# Patient Record
Sex: Female | Born: 1990
Health system: Southern US, Community
[De-identification: ages and names within clinical notes are randomized; demographics above are authoritative.]

## PROBLEM LIST (undated history)

## (undated) ENCOUNTER — Inpatient Hospital Stay (HOSPITAL_COMMUNITY): Payer: Self-pay

## (undated) DIAGNOSIS — O09299 Supervision of pregnancy with other poor reproductive or obstetric history, unspecified trimester: Secondary | ICD-10-CM

## (undated) DIAGNOSIS — G2581 Restless legs syndrome: Secondary | ICD-10-CM

## (undated) DIAGNOSIS — Z8619 Personal history of other infectious and parasitic diseases: Secondary | ICD-10-CM

## (undated) DIAGNOSIS — F419 Anxiety disorder, unspecified: Secondary | ICD-10-CM

## (undated) HISTORY — DX: Personal history of other infectious and parasitic diseases: Z86.19

## (undated) HISTORY — PX: TONSILLECTOMY: SUR1361

## (undated) HISTORY — DX: Supervision of pregnancy with other poor reproductive or obstetric history, unspecified trimester: O09.299

## (undated) HISTORY — PX: WISDOM TOOTH EXTRACTION: SHX21

---

## 2015-02-11 LAB — OB RESULTS CONSOLE ABO/RH: RH TYPE: POSITIVE

## 2015-02-11 LAB — OB RESULTS CONSOLE ANTIBODY SCREEN: ANTIBODY SCREEN: NEGATIVE

## 2015-02-11 LAB — OB RESULTS CONSOLE HEPATITIS B SURFACE ANTIGEN: Hepatitis B Surface Ag: NEGATIVE

## 2015-02-11 LAB — OB RESULTS CONSOLE RUBELLA ANTIBODY, IGM: Rubella: IMMUNE

## 2015-02-11 LAB — OB RESULTS CONSOLE RPR: RPR: NONREACTIVE

## 2015-02-11 LAB — OB RESULTS CONSOLE HIV ANTIBODY (ROUTINE TESTING): HIV: NONREACTIVE

## 2015-02-17 ENCOUNTER — Other Ambulatory Visit (HOSPITAL_COMMUNITY)
Admission: RE | Admit: 2015-02-17 | Discharge: 2015-02-17 | Disposition: A | Discharge status: Home / Self Care | Payer: BLUE CROSS/BLUE SHIELD | Source: Ambulatory Visit | Attending: Obstetrics and Gynecology | Admitting: Obstetrics and Gynecology

## 2015-02-17 DIAGNOSIS — Z01419 Encounter for gynecological examination (general) (routine) without abnormal findings: Secondary | ICD-10-CM | POA: Insufficient documentation

## 2015-02-17 LAB — OB RESULTS CONSOLE GC/CHLAMYDIA
Chlamydia: NEGATIVE
GC PROBE AMP, GENITAL: NEGATIVE

## 2015-02-17 LAB — HM PAP SMEAR: HM Pap smear: NEGATIVE

## 2015-03-14 NOTE — L&D Delivery Note (Signed)
Delivery Note At 11:29 AM a viable female was delivered via Vaginal, Spontaneous Delivery (Presentation: Right Occiput Posterior).  APGAR: 7, 9; weight  pending   Placenta status: Intact, Spontaneous.  Cord: 3 vessels with the following complications: None.    Anesthesia: Epidural  Episiotomy: Median;Right Mediolateral Lacerations:   Suture Repair: 2.0 3.0 Est. Blood Loss (mL): 300  Mom to postpartum.  Baby to Couplet care / Skin to Skin.  Myna HidalgoOZAN, Jillann Charette, M 08/28/2015, 12:40 PM

## 2015-06-11 ENCOUNTER — Other Ambulatory Visit: Payer: Self-pay | Admitting: Obstetrics and Gynecology

## 2015-06-11 DIAGNOSIS — O368390 Maternal care for abnormalities of the fetal heart rate or rhythm, unspecified trimester, not applicable or unspecified: Secondary | ICD-10-CM

## 2015-06-15 ENCOUNTER — Ambulatory Visit (HOSPITAL_COMMUNITY)
Admission: RE | Admit: 2015-06-15 | Discharge: 2015-06-15 | Disposition: A | Discharge status: Home / Self Care | Payer: BLUE CROSS/BLUE SHIELD | Source: Ambulatory Visit | Attending: Obstetrics and Gynecology | Admitting: Obstetrics and Gynecology

## 2015-06-15 ENCOUNTER — Encounter (HOSPITAL_COMMUNITY): Payer: Self-pay

## 2015-06-15 ENCOUNTER — Other Ambulatory Visit: Payer: Self-pay | Admitting: Obstetrics and Gynecology

## 2015-06-15 DIAGNOSIS — O99323 Drug use complicating pregnancy, third trimester: Secondary | ICD-10-CM

## 2015-06-15 DIAGNOSIS — Z36 Encounter for antenatal screening of mother: Secondary | ICD-10-CM | POA: Diagnosis present

## 2015-06-15 DIAGNOSIS — Z3A29 29 weeks gestation of pregnancy: Secondary | ICD-10-CM | POA: Insufficient documentation

## 2015-06-15 DIAGNOSIS — O368390 Maternal care for abnormalities of the fetal heart rate or rhythm, unspecified trimester, not applicable or unspecified: Secondary | ICD-10-CM

## 2015-06-15 DIAGNOSIS — Z3689 Encounter for other specified antenatal screening: Secondary | ICD-10-CM

## 2015-06-15 NOTE — Consult Note (Signed)
Maternal Fetal Medicine Consultation  Requesting Provider(s): Geryl RankinsEvelyn Varnado, MD  Reason for consultation: Suspected fetal arrhythmia  HPI: Sandy Johnson is a 25 yo G3P0020, EDD 08/29/2015 who is currently at 29w 2d - seen for consultation due to suspected fetal arrhythmia.  The patient reports that she had an irregular fetal heart beat that was noted on her routine prenatal visit.  Her pregnancy course has been complicated by a history of marijuana  Use - but denies any use since becoming pregnant.  She reports drinking about one caffeinated soda / day and denies coffee or tea use.  She does report using and "organic" lotion for her stretch marks.  She is without complaints today.  OB History: OB History    Gravida Para Term Preterm AB TAB SAB Ectopic Multiple Living   3    2 1 1          PMH: ADD  PSH: Tonsillectomy  Meds: Prenatal vitamins  Allergies:  Allergies  Allergen Reactions  . Codeine    FH: Denies family history of birth defects or hereditary disorders  Soc:  Social History   Social History  . Marital Status: Single    Spouse Name: N/A  . Number of Children: N/A  . Years of Education: N/A   Occupational History  . Not on file.   Social History Main Topics  . Smoking status: Never Smoker   . Smokeless tobacco: Never Used  . Alcohol Use: No  . Drug Use: Yes    Special: Marijuana     Comment: not with this pregnancy   . Sexual Activity: Not on file   Other Topics Concern  . Not on file   Social History Narrative  . No narrative on file    PE:  191 lbs, 118/72, 98  GEN: well-appearing female ABD: gravid, NT  Ultrasound: Single IUP at 29w 2d Normal fetal anatomic survey Some ectopy noted - appears to have some infrequent premature atrial contractions The fetal heart anatomy appears normal Fetal growth is appropriate (40th %tile) Anterior placenta Normal amniotic fluid volume   A/P: 1) Single IUP at 29w 2d  2) Hx of marijuana use (not  during pregnancy)  3) Premature atrial contractions - Some fetal premature atrial contractions are noted on ultrasound today.  This is considered a benign fetal dysrhythmia at usually resolved after delivery and generally does not require further follow up.  There is about a 1% risk that this may evolve into fetal SVT - would recommend weekly fetal doptones in the clinic.  As long as there is no evidence of fetal tachycardia (FHT >200 bpm), no further evaluation is necessary.   Thank you for the opportunity to be a part of the care of [name]. Please contact our office if we can be of further assistance.   I spent approximately 30 minutes with this patient with over 50% of time spent in face-to-face counseling.  Alpha GulaPaul Nithya Meriweather, MD Maternal Fetal Medicine

## 2015-06-21 ENCOUNTER — Encounter (HOSPITAL_COMMUNITY): Payer: Self-pay | Admitting: *Deleted

## 2015-06-21 ENCOUNTER — Inpatient Hospital Stay (HOSPITAL_COMMUNITY)
Admission: AD | Admit: 2015-06-21 | Discharge: 2015-06-21 | Disposition: A | Discharge status: Home / Self Care | Payer: BLUE CROSS/BLUE SHIELD | Source: Ambulatory Visit | Attending: Obstetrics and Gynecology | Admitting: Obstetrics and Gynecology

## 2015-06-21 ENCOUNTER — Inpatient Hospital Stay (HOSPITAL_COMMUNITY): Payer: BLUE CROSS/BLUE SHIELD

## 2015-06-21 DIAGNOSIS — O368190 Decreased fetal movements, unspecified trimester, not applicable or unspecified: Secondary | ICD-10-CM | POA: Diagnosis not present

## 2015-06-21 DIAGNOSIS — O368130 Decreased fetal movements, third trimester, not applicable or unspecified: Secondary | ICD-10-CM | POA: Diagnosis present

## 2015-06-21 DIAGNOSIS — Z3A30 30 weeks gestation of pregnancy: Secondary | ICD-10-CM | POA: Diagnosis not present

## 2015-06-21 HISTORY — DX: Restless legs syndrome: G25.81

## 2015-06-21 HISTORY — DX: Anxiety disorder, unspecified: F41.9

## 2015-06-21 NOTE — MAU Note (Signed)
NO fetal movement in 3 days. No other complaints

## 2015-06-21 NOTE — Discharge Instructions (Signed)
Fetal Movement Counts  Patient Name: __________________________________________________ Patient Due Date: ____________________  Performing a fetal movement count is highly recommended in high-risk pregnancies, but it is good for every pregnant woman to do. Your health care provider may ask you to start counting fetal movements at 28 weeks of the pregnancy. Fetal movements often increase:  · After eating a full meal.  · After physical activity.  · After eating or drinking something sweet or cold.  · At rest.  Pay attention to when you feel the baby is most active. This will help you notice a pattern of your baby's sleep and wake cycles and what factors contribute to an increase in fetal movement. It is important to perform a fetal movement count at the same time each day when your baby is normally most active.   HOW TO COUNT FETAL MOVEMENTS  1. Find a quiet and comfortable area to sit or lie down on your left side. Lying on your left side provides the best blood and oxygen circulation to your baby.  2. Write down the day and time on a sheet of paper or in a journal.  3. Start counting kicks, flutters, swishes, rolls, or jabs in a 2-hour period. You should feel at least 10 movements within 2 hours.  4. If you do not feel 10 movements in 2 hours, wait 2-3 hours and count again. Look for a change in the pattern or not enough counts in 2 hours.  SEEK MEDICAL CARE IF:  · You feel less than 10 counts in 2 hours, tried twice.  · There is no movement in over an hour.  · The pattern is changing or taking longer each day to reach 10 counts in 2 hours.  · You feel the baby is not moving as he or she usually does.  Date: ____________ Movements: ____________ Start time: ____________ Finish time: ____________   Date: ____________ Movements: ____________ Start time: ____________ Finish time: ____________  Date: ____________ Movements: ____________ Start time: ____________ Finish time: ____________  Date: ____________ Movements:  ____________ Start time: ____________ Finish time: ____________  Date: ____________ Movements: ____________ Start time: ____________ Finish time: ____________  Date: ____________ Movements: ____________ Start time: ____________ Finish time: ____________  Date: ____________ Movements: ____________ Start time: ____________ Finish time: ____________  Date: ____________ Movements: ____________ Start time: ____________ Finish time: ____________   Date: ____________ Movements: ____________ Start time: ____________ Finish time: ____________  Date: ____________ Movements: ____________ Start time: ____________ Finish time: ____________  Date: ____________ Movements: ____________ Start time: ____________ Finish time: ____________  Date: ____________ Movements: ____________ Start time: ____________ Finish time: ____________  Date: ____________ Movements: ____________ Start time: ____________ Finish time: ____________  Date: ____________ Movements: ____________ Start time: ____________ Finish time: ____________  Date: ____________ Movements: ____________ Start time: ____________ Finish time: ____________   Date: ____________ Movements: ____________ Start time: ____________ Finish time: ____________  Date: ____________ Movements: ____________ Start time: ____________ Finish time: ____________  Date: ____________ Movements: ____________ Start time: ____________ Finish time: ____________  Date: ____________ Movements: ____________ Start time: ____________ Finish time: ____________  Date: ____________ Movements: ____________ Start time: ____________ Finish time: ____________  Date: ____________ Movements: ____________ Start time: ____________ Finish time: ____________  Date: ____________ Movements: ____________ Start time: ____________ Finish time: ____________   Date: ____________ Movements: ____________ Start time: ____________ Finish time: ____________  Date: ____________ Movements: ____________ Start time: ____________ Finish  time: ____________  Date: ____________ Movements: ____________ Start time: ____________ Finish time: ____________  Date: ____________ Movements: ____________ Start time:   ____________ Finish time: ____________  Date: ____________ Movements: ____________ Start time: ____________ Finish time: ____________  Date: ____________ Movements: ____________ Start time: ____________ Finish time: ____________  Date: ____________ Movements: ____________ Start time: ____________ Finish time: ____________   Date: ____________ Movements: ____________ Start time: ____________ Finish time: ____________  Date: ____________ Movements: ____________ Start time: ____________ Finish time: ____________  Date: ____________ Movements: ____________ Start time: ____________ Finish time: ____________  Date: ____________ Movements: ____________ Start time: ____________ Finish time: ____________  Date: ____________ Movements: ____________ Start time: ____________ Finish time: ____________  Date: ____________ Movements: ____________ Start time: ____________ Finish time: ____________  Date: ____________ Movements: ____________ Start time: ____________ Finish time: ____________   Date: ____________ Movements: ____________ Start time: ____________ Finish time: ____________  Date: ____________ Movements: ____________ Start time: ____________ Finish time: ____________  Date: ____________ Movements: ____________ Start time: ____________ Finish time: ____________  Date: ____________ Movements: ____________ Start time: ____________ Finish time: ____________  Date: ____________ Movements: ____________ Start time: ____________ Finish time: ____________  Date: ____________ Movements: ____________ Start time: ____________ Finish time: ____________  Date: ____________ Movements: ____________ Start time: ____________ Finish time: ____________   Date: ____________ Movements: ____________ Start time: ____________ Finish time: ____________  Date: ____________  Movements: ____________ Start time: ____________ Finish time: ____________  Date: ____________ Movements: ____________ Start time: ____________ Finish time: ____________  Date: ____________ Movements: ____________ Start time: ____________ Finish time: ____________  Date: ____________ Movements: ____________ Start time: ____________ Finish time: ____________  Date: ____________ Movements: ____________ Start time: ____________ Finish time: ____________  Date: ____________ Movements: ____________ Start time: ____________ Finish time: ____________   Date: ____________ Movements: ____________ Start time: ____________ Finish time: ____________  Date: ____________ Movements: ____________ Start time: ____________ Finish time: ____________  Date: ____________ Movements: ____________ Start time: ____________ Finish time: ____________  Date: ____________ Movements: ____________ Start time: ____________ Finish time: ____________  Date: ____________ Movements: ____________ Start time: ____________ Finish time: ____________  Date: ____________ Movements: ____________ Start time: ____________ Finish time: ____________     This information is not intended to replace advice given to you by your health care provider. Make sure you discuss any questions you have with your health care provider.     Document Released: 03/29/2006 Document Revised: 03/20/2014 Document Reviewed: 12/25/2011  Elsevier Interactive Patient Education ©2016 [employer] Inc[employer]

## 2015-06-21 NOTE — MAU Provider Note (Signed)
History   G3P0020 @ 30.1 wks in with c/o no fetal movement x 3 days. Upon admission to unit and placing pt on EFM movement was noted by staff and patient.  CSN: [id number]  Arrival date & time 06/21/15  [id number]   First Provider Initiated Contact with Patient 06/21/15 1905      Chief Complaint  Patient presents with  . Decreased Fetal Movement    HPI  No past medical history on file.  No past surgical history on file.  No family history on file.  Social History  Substance Use Topics  . Smoking status: Never Smoker   . Smokeless tobacco: Never Used  . Alcohol Use: No    OB History    Gravida Para Term Preterm AB TAB SAB Ectopic Multiple Living   3    2 1 1          Review of Systems  Constitutional: Negative.   HENT: Negative.   Eyes: Negative.   Respiratory: Negative.   Cardiovascular: Negative.   Gastrointestinal: Negative.   Endocrine: Negative.   Genitourinary: Negative.   Musculoskeletal: Negative.   Skin: Negative.   Allergic/Immunologic: Negative.   Neurological: Negative.   Hematological: Negative.   Psychiatric/Behavioral: Negative.     Allergies  Codeine  Home Medications  No current outpatient prescriptions on file.  BP 120/73 mmHg  Pulse 92  Temp(Src) 98.1 F (36.7 C) (Oral)  Resp 18  Physical Exam  Constitutional: She is oriented to person, place, and time. She appears well-developed and well-nourished.  HENT:  Head: Normocephalic.  Eyes: Pupils are equal, round, and reactive to light.  Neck: Normal range of motion.  Cardiovascular: Normal rate, regular rhythm, normal heart sounds and intact distal pulses.   Pulmonary/Chest: Effort normal and breath sounds normal.  Abdominal: Soft. Bowel sounds are normal.  Genitourinary: Vagina normal and uterus normal.  Musculoskeletal: Normal range of motion.  Neurological: She is alert and oriented to person, place, and time. She has normal reflexes.  Skin: Skin is warm and dry.  Psychiatric: She  has a normal mood and affect. Her behavior is normal. Judgment and thought content normal.    MAU Course  Procedures (including critical care time)  Labs Reviewed - No data to display No results found.   No diagnosis found.    MDM  BPP 8/8. Dr. Stefano GaulStringer notified and pt. To be discharged

## 2015-07-29 LAB — OB RESULTS CONSOLE GBS: STREP GROUP B AG: NEGATIVE

## 2015-08-28 ENCOUNTER — Encounter (HOSPITAL_COMMUNITY): Payer: Self-pay

## 2015-08-28 ENCOUNTER — Inpatient Hospital Stay (HOSPITAL_COMMUNITY): Payer: BLUE CROSS/BLUE SHIELD | Admitting: Anesthesiology

## 2015-08-28 ENCOUNTER — Inpatient Hospital Stay (HOSPITAL_COMMUNITY)
Admission: AC | Admit: 2015-08-28 | Discharge: 2015-08-30 | DRG: 775 | Disposition: A | Discharge status: Home / Self Care | Payer: BLUE CROSS/BLUE SHIELD | Source: Ambulatory Visit | Attending: Obstetrics & Gynecology | Admitting: Obstetrics & Gynecology

## 2015-08-28 DIAGNOSIS — O99324 Drug use complicating childbirth: Secondary | ICD-10-CM | POA: Diagnosis present

## 2015-08-28 DIAGNOSIS — Z87891 Personal history of nicotine dependence: Secondary | ICD-10-CM

## 2015-08-28 DIAGNOSIS — O99344 Other mental disorders complicating childbirth: Principal | ICD-10-CM | POA: Diagnosis present

## 2015-08-28 DIAGNOSIS — Z833 Family history of diabetes mellitus: Secondary | ICD-10-CM

## 2015-08-28 DIAGNOSIS — O09299 Supervision of pregnancy with other poor reproductive or obstetric history, unspecified trimester: Secondary | ICD-10-CM

## 2015-08-28 DIAGNOSIS — F419 Anxiety disorder, unspecified: Secondary | ICD-10-CM | POA: Diagnosis present

## 2015-08-28 DIAGNOSIS — F1210 Cannabis abuse, uncomplicated: Secondary | ICD-10-CM | POA: Diagnosis present

## 2015-08-28 DIAGNOSIS — F1410 Cocaine abuse, uncomplicated: Secondary | ICD-10-CM | POA: Diagnosis present

## 2015-08-28 DIAGNOSIS — Z3A39 39 weeks gestation of pregnancy: Secondary | ICD-10-CM

## 2015-08-28 DIAGNOSIS — F1911 Other psychoactive substance abuse, in remission: Secondary | ICD-10-CM

## 2015-08-28 DIAGNOSIS — Z3403 Encounter for supervision of normal first pregnancy, third trimester: Secondary | ICD-10-CM | POA: Diagnosis present

## 2015-08-28 DIAGNOSIS — Z8249 Family history of ischemic heart disease and other diseases of the circulatory system: Secondary | ICD-10-CM | POA: Diagnosis not present

## 2015-08-28 HISTORY — DX: Supervision of pregnancy with other poor reproductive or obstetric history, unspecified trimester: O09.299

## 2015-08-28 LAB — ABO/RH: ABO/RH(D): O POS

## 2015-08-28 LAB — TYPE AND SCREEN
ABO/RH(D): O POS
Antibody Screen: NEGATIVE

## 2015-08-28 LAB — RAPID URINE DRUG SCREEN, HOSP PERFORMED
Amphetamines: NOT DETECTED
BARBITURATES: NOT DETECTED
BENZODIAZEPINES: NOT DETECTED
COCAINE: NOT DETECTED
OPIATES: NOT DETECTED
Tetrahydrocannabinol: POSITIVE — AB

## 2015-08-28 LAB — CBC
HCT: 36.8 % (ref 36.0–46.0)
HEMOGLOBIN: 12.8 g/dL (ref 12.0–15.0)
MCH: 27.8 pg (ref 26.0–34.0)
MCHC: 34.8 g/dL (ref 30.0–36.0)
MCV: 80 fL (ref 78.0–100.0)
Platelets: 208 10*3/uL (ref 150–400)
RBC: 4.6 MIL/uL (ref 3.87–5.11)
RDW: 13.6 % (ref 11.5–15.5)
WBC: 19.4 10*3/uL — ABNORMAL HIGH (ref 4.0–10.5)

## 2015-08-28 LAB — RPR: RPR: NONREACTIVE

## 2015-08-28 MED ORDER — LACTATED RINGERS IV SOLN: 500.0000 mL | Freq: Once | INTRAVENOUS | Status: DC

## 2015-08-28 MED ORDER — FENTANYL 2.5 MCG/ML BUPIVACAINE 1/10 % EPIDURAL INFUSION (WH - ANES)
14.0000 mL/h | INTRAMUSCULAR | Status: DC | PRN
  Administered 2015-08-28: 14 mL/h via EPIDURAL
  Filled 2015-08-28: qty 125

## 2015-08-28 MED ORDER — DIPHENHYDRAMINE HCL 50 MG/ML IJ SOLN: 12.5000 mg | INTRAMUSCULAR | Status: DC | PRN

## 2015-08-28 MED ORDER — LACTATED RINGERS IV SOLN
INTRAVENOUS | Status: DC
  Administered 2015-08-28: 11:00:00 via INTRAVENOUS

## 2015-08-28 MED ORDER — PHENYLEPHRINE 40 MCG/ML (10ML) SYRINGE FOR IV PUSH (FOR BLOOD PRESSURE SUPPORT)
80.0000 ug | PREFILLED_SYRINGE | INTRAVENOUS | Status: DC | PRN
  Filled 2015-08-28: qty 5
  Filled 2015-08-28: qty 10

## 2015-08-28 MED ORDER — ACETAMINOPHEN 325 MG PO TABS: 650.0000 mg | ORAL_TABLET | ORAL | Status: DC | PRN

## 2015-08-28 MED ORDER — LIDOCAINE HCL (PF) 1 % IJ SOLN
INTRAMUSCULAR | Status: DC | PRN
  Administered 2015-08-28 (×2): 6 mL

## 2015-08-28 MED ORDER — BENZOCAINE-MENTHOL 20-0.5 % EX AERO
1.0000 "application " | INHALATION_SPRAY | CUTANEOUS | Status: DC | PRN
  Filled 2015-08-28: qty 56

## 2015-08-28 MED ORDER — SOD CITRATE-CITRIC ACID 500-334 MG/5ML PO SOLN: 30.0000 mL | ORAL | Status: DC | PRN

## 2015-08-28 MED ORDER — IBUPROFEN 600 MG PO TABS
600.0000 mg | ORAL_TABLET | Freq: Four times a day (QID) | ORAL | Status: DC
  Administered 2015-08-28 – 2015-08-30 (×8): 600 mg via ORAL
  Filled 2015-08-28 (×8): qty 1

## 2015-08-28 MED ORDER — ZOLPIDEM TARTRATE 5 MG PO TABS: 5.0000 mg | ORAL_TABLET | Freq: Every evening | ORAL | Status: DC | PRN

## 2015-08-28 MED ORDER — LACTATED RINGERS IV SOLN
500.0000 mL | INTRAVENOUS | Status: DC | PRN
  Administered 2015-08-28: 1000 mL via INTRAVENOUS

## 2015-08-28 MED ORDER — WITCH HAZEL-GLYCERIN EX PADS: 1.0000 "application " | MEDICATED_PAD | CUTANEOUS | Status: DC | PRN

## 2015-08-28 MED ORDER — EPHEDRINE 5 MG/ML INJ
10.0000 mg | INTRAVENOUS | Status: DC | PRN
  Filled 2015-08-28: qty 2

## 2015-08-28 MED ORDER — ONDANSETRON HCL 4 MG PO TABS: 4.0000 mg | ORAL_TABLET | ORAL | Status: DC | PRN

## 2015-08-28 MED ORDER — LIDOCAINE HCL (PF) 1 % IJ SOLN
30.0000 mL | INTRAMUSCULAR | Status: DC | PRN
Start: 2015-08-28 — End: 2015-08-28
  Filled 2015-08-28: qty 30

## 2015-08-28 MED ORDER — ONDANSETRON HCL 4 MG/2ML IJ SOLN
4.0000 mg | INTRAMUSCULAR | Status: DC | PRN
Start: 2015-08-28 — End: 2015-08-30

## 2015-08-28 MED ORDER — OXYTOCIN 40 UNITS IN LACTATED RINGERS INFUSION - SIMPLE MED
2.5000 [IU]/h | INTRAVENOUS | Status: DC
  Filled 2015-08-28: qty 1000

## 2015-08-28 MED ORDER — PRENATAL MULTIVITAMIN CH
1.0000 | ORAL_TABLET | Freq: Every day | ORAL | Status: DC
  Administered 2015-08-29 – 2015-08-30 (×2): 1 via ORAL
  Filled 2015-08-28 (×2): qty 1

## 2015-08-28 MED ORDER — SIMETHICONE 80 MG PO CHEW: 80.0000 mg | CHEWABLE_TABLET | ORAL | Status: DC | PRN

## 2015-08-28 MED ORDER — FLEET ENEMA 7-19 GM/118ML RE ENEM: 1.0000 | ENEMA | RECTAL | Status: DC | PRN

## 2015-08-28 MED ORDER — PHENYLEPHRINE 40 MCG/ML (10ML) SYRINGE FOR IV PUSH (FOR BLOOD PRESSURE SUPPORT)
80.0000 ug | PREFILLED_SYRINGE | INTRAVENOUS | Status: DC | PRN
  Filled 2015-08-28: qty 5

## 2015-08-28 MED ORDER — ACETAMINOPHEN 325 MG PO TABS
650.0000 mg | ORAL_TABLET | ORAL | Status: DC | PRN
  Administered 2015-08-29: 650 mg via ORAL
  Filled 2015-08-28: qty 2

## 2015-08-28 MED ORDER — OXYTOCIN BOLUS FROM INFUSION: 500.0000 mL | INTRAVENOUS | Status: DC

## 2015-08-28 MED ORDER — ONDANSETRON HCL 4 MG/2ML IJ SOLN: 4.0000 mg | Freq: Four times a day (QID) | INTRAMUSCULAR | Status: DC | PRN

## 2015-08-28 MED ORDER — COCONUT OIL OIL
1.0000 "application " | TOPICAL_OIL | Status: DC | PRN
  Administered 2015-08-29: 1 via TOPICAL
  Filled 2015-08-28: qty 120

## 2015-08-28 MED ORDER — DIBUCAINE 1 % RE OINT: 1.0000 "application " | TOPICAL_OINTMENT | RECTAL | Status: DC | PRN

## 2015-08-28 MED ORDER — SENNOSIDES-DOCUSATE SODIUM 8.6-50 MG PO TABS
2.0000 | ORAL_TABLET | ORAL | Status: DC
  Filled 2015-08-28 (×2): qty 2

## 2015-08-28 MED ORDER — DIPHENHYDRAMINE HCL 25 MG PO CAPS: 25.0000 mg | ORAL_CAPSULE | Freq: Four times a day (QID) | ORAL | Status: DC | PRN

## 2015-08-28 MED ORDER — FENTANYL CITRATE (PF) 100 MCG/2ML IJ SOLN: 100.0000 ug | INTRAMUSCULAR | Status: DC | PRN

## 2015-08-28 NOTE — MAU Note (Signed)
Contractions since 0230. Every 2 min.  2 cm in office. No bleeding.  Mucus d/c. Came here by EMS. Baby moving well.

## 2015-08-28 NOTE — Lactation Note (Signed)
This note was copied from a baby's chart. Lactation Consultation Note  Patient Name: Sandy [name] WUJWJ'XToday's Date: 08/28/2015 Reason for consult: Initial assessment Mom reporting baby not latching to left breast. LC assisted in football hold, baby latched easily using breast compression to left breast. Basic teaching reviewed with Mom, encouraged to BF with feeding ques, cluster feeding discussed.  Lactation brochure left for review, advised of OP services and support group. Risk of marijuana use with BF discussed, hand out given. Encouraged not to use MJ while breastfeeding. Encouraged to call for assist as needed.   Maternal Data Has patient been taught Hand Expression?: Yes Does the patient have breastfeeding experience prior to this delivery?: No  Feeding Feeding Type: Breast Fed Length of feed: 15 min  LATCH Score/Interventions Latch: Grasps breast easily, tongue down, lips flanged, rhythmical sucking. Intervention(s): Adjust position;Assist with latch;Breast massage;Breast compression  Audible Swallowing: A few with stimulation  Type of Nipple: Everted at rest and after stimulation  Comfort (Breast/Nipple): Soft / non-tender     Hold (Positioning): Assistance needed to correctly position infant at breast and maintain latch. Intervention(s): Breastfeeding basics reviewed;Support Pillows;Position options;Skin to skin  LATCH Score: 8  Lactation Tools Discussed/Used WIC Program: No   Consult Status Consult Status: Follow-up Date: 08/29/15 Follow-up type: In-patient    Alfred LevinsGranger, Rykker Coviello Ann 08/28/2015, 10:49 PM

## 2015-08-28 NOTE — Progress Notes (Addendum)
CLINICAL SOCIAL WORK MATERNAL/CHILD NOTE  Patient Details  Name: Sandy Johnson MRN: [id number] Date of Birth: 08/28/2016  Date: 08/28/2015  Clinical Social Worker Initiating Note: Ander Purpura CarterDate/ Time Initiated: 08/28/15/1510   Child's Name: Sandy Johnson  Legal Guardian: Mother   Need for Interpreter: None   Date of Referral: 08/28/15   Reason for Referral: Current Substance Use/Substance Use During Pregnancy  (Mother tested positive for [facility] on day of delivery)   Referral Source: RN   Address: [address]  Phone number: [phone]   Household Members: Self, Significant Other   Natural Supports (not living in the home): Friends, Immediate Family   Professional Supports:None   Employment:Part-time   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 Pt appeared apprehensive on approach. Pt expressed surprise and concern when CSW informed MOB of the positive drug screen as CSW was the first to inform her. MOB expressed that she had not consumed THC since she found out that she was pregnant; however, Pt feels that inhaling significant amounts of second hand smoke on a recent trip (including in unventilated cars) is the cause for the positive result. MOB became tearful and described feeling worried that she would lose custody of her child. MOB reports that 4  weeks ago, she was tested at her outpatient OBGYN office and the results were negative. CSW informed MOB that the infant's UDS results would determine whether or not a CPS report would need to be made. CSW offered emotional support and answered questions that MOB had. During the assessment, MOB's mother entered the room and found MOB tearful. MOB explained what happened and expressed that she is scared. MGM was supportive.  MOB lives with boyfriend and FOB and works part-time at [employer]; she is currently on maternity leave. CSW reports that staff will inform her of the UDS findings and further steps.   CSW Plan/Description:  (TBD based on infant UDS)    Bo Mcclintock, LCSW 08/28/2015, 3:18 PM

## 2015-08-28 NOTE — Anesthesia Preprocedure Evaluation (Addendum)
Anesthesia Evaluation  Patient identified by MRN, date of birth, ID band Patient awake    Reviewed: Allergy & Precautions, NPO status , Patient's Chart, lab work & pertinent test results  Airway Mallampati: II  TM Distance: >3 FB Neck ROM: Full    Dental  (+) Teeth Intact   Pulmonary former smoker,    breath sounds clear to auscultation       Cardiovascular negative cardio ROS   Rhythm:Regular Rate:Normal     Neuro/Psych Anxiety negative neurological ROS     GI/Hepatic negative GI ROS, Neg liver ROS,   Endo/Other  negative endocrine ROS  Renal/GU   negative genitourinary   Musculoskeletal negative musculoskeletal ROS (+)   Abdominal   Peds negative pediatric ROS (+)  Hematology negative hematology ROS (+)   Anesthesia Other Findings   Reproductive/Obstetrics (+) Pregnancy                            Lab Results  Component Value Date   WBC 19.4* 08/28/2015   HGB 12.8 08/28/2015   HCT 36.8 08/28/2015   MCV 80.0 08/28/2015   PLT 208 08/28/2015   No results found for: INR, PROTIME   Anesthesia Physical Anesthesia Plan  ASA: II  Anesthesia Plan: Epidural   Post-op Pain Management:    Induction:   Airway Management Planned:   Additional Equipment:   Intra-op Plan:   Post-operative Plan:   Informed Consent: I have reviewed the patients History and Physical, chart, labs and discussed the procedure including the risks, benefits and alternatives for the proposed anesthesia with the patient or authorized representative who has indicated his/her understanding and acceptance.     Plan Discussed with:   Anesthesia Plan Comments: (Informed consent obtained prior to proceeding including risk of failure, 1% risk of PDPH, risk of minor discomfort and bruising.  Discussed rare but serious complications including epidural abscess, permanent nerve injury, epidural hematoma.  Discussed  alternatives to epidural analgesia and patient desires to proceed.  Timeout performed pre-procedure verifying patient name, procedure, and platelet count.  Patient tolerated procedure well. )       Anesthesia Quick Evaluation

## 2015-08-28 NOTE — H&P (Signed)
Sandy EhlersChristina Johnson is a 25 y.o. female presenting for contractions increasing in frequency and intensity since 0230 this morning.   . Maternal Medical History:  Reason for admission: Contractions.   Contractions: Onset was 3-5 hours ago.   Frequency: irregular.   Duration is approximately 30 seconds.   Perceived severity is moderate.    Fetal activity: Perceived fetal activity is normal.   Last perceived fetal movement was within the past hour.    Prenatal complications: Substance abuse.   Prenatal Complications - Diabetes: none.    OB History    Gravida Para Term Preterm AB TAB SAB Ectopic Multiple Living   3    2 1 1         Past Medical History  Diagnosis Date  . Anxiety   . RLS (restless legs syndrome)    Past Surgical History  Procedure Laterality Date  . Tonsillectomy    . Wisdom tooth extraction     Family History: family history includes Diabetes in her father, maternal grandfather, and maternal grandmother; Heart disease in her father and paternal grandfather; Hypertension in her father. Social History:  reports that she has quit smoking. She has never used smokeless tobacco. She reports that she uses illicit drugs (Marijuana). She reports that she does not drink alcohol.   Prenatal Transfer Tool  Maternal Diabetes: No Genetic Screening: Normal Maternal Ultrasounds/Referrals: Normal Fetal Ultrasounds or other Referrals:  None Maternal Substance Abuse:  Yes:  Type: Marijuana, Cocaine, Other: Zanax Significant Maternal Medications:  None Significant Maternal Lab Results:  Lab values include: Group B Strep negative Other Comments:  None  ROS   FHR:  Cat 1, 135 bpm, moderate variability, +accels, no decels UC: irregular, every 3-5 min,   Dilation: 4 Effacement (%): 90 Station: -1 Exam by:: Fleet Contrasachel Stall CNM Height 5\' 4"  (1.626 m), weight 91.173 kg (201 lb). Exam Physical Exam  Constitutional: She is oriented to person, place, and time. She appears  well-developed. She appears distressed.  HENT:  Head: Normocephalic.  Eyes: Pupils are equal, round, and reactive to light.  Neck: Normal range of motion.  Cardiovascular: Normal rate and regular rhythm.   Respiratory: Effort normal and breath sounds normal.  GI: Soft.  Musculoskeletal: Normal range of motion.  Neurological: She is alert and oriented to person, place, and time.  Skin: Skin is warm and dry.  Psychiatric: She has a normal mood and affect. Her behavior is normal.    Prenatal labs: ABO, Rh: O/Positive/-- (12/01 0000) Antibody: Negative (12/01 0000) Rubella: Immune (12/01 0000) RPR: Nonreactive (12/01 0000)  HBsAg: Negative (12/01 0000)  HIV: Non-reactive (12/01 0000)  GBS: Negative (05/18 0000)   Assessment/Plan: GBS Negative Cat 1 FT Early labor Admit to birthing suites May have Pain med/Epidural PRN Routine [facility] orders Expectant management Anticipate SVD     Alphonzo SeveranceRachel Stall 08/28/2015, 7:06 AM

## 2015-08-28 NOTE — Anesthesia Procedure Notes (Signed)
Epidural Patient location during procedure: OB  Staffing Anesthesiologist: Leslyn Monda  Preanesthetic Checklist Completed: patient identified, site marked, surgical consent, pre-op evaluation, timeout performed, IV checked, risks and benefits discussed and monitors and equipment checked  Epidural Patient position: sitting Prep: DuraPrep Patient monitoring: blood pressure and heart rate Approach: midline Location: L3-L4 Injection technique: LOR saline  Needle:  Needle type: Tuohy  Needle gauge: 17 G Needle length: 9 cm Needle insertion depth: 6 cm Catheter type: closed end flexible Catheter size: 19 Gauge Catheter at skin depth: 13 cm Test dose: negative and Other  Assessment Events: blood not aspirated, injection not painful, no injection resistance, negative IV test and no paresthesia  Additional Notes Reason for block:procedure for pain   

## 2015-08-28 NOTE — Progress Notes (Signed)
OB PN:  S: Pt resting comfortably, no acute complaints  O: BP 122/75 mmHg  Pulse 102  Temp(Src) 97.7 F (36.5 C)  Resp 16  Ht 5\' 4"  (1.626 m)  Wt 91.173 kg (201 lb)  BMI 34.48 kg/m2  SpO2 99%  FHT: 140bpm, moderate variablity, + accels, no decels Toco: q3-614min SVE: 5/90/-1, FSE placed, AROM though suspect pt already ruptured  A/P: 25 y.o. G3P0020 @ 5732w6d 1. FWB: Cat. I, FSE placed due to difficulty tracing FHT s/p epidural 2. Labor: expectant management Pain: continue with epidural GBS: negative -h/o drug use, +marijuana use, []  Utox collected  Myna HidalgoJennifer Clarke Peretz, OhioDO [phone] (pager) [phone] (office)

## 2015-08-28 NOTE — Progress Notes (Signed)
OB PN:  S: Pt starting to feel increased pressure and discomfort  O: BP 105/63 mmHg  Pulse 106  Temp(Src) 97.7 F (36.5 C)  Resp 16  Ht 5\' 4"  (1.626 m)  Wt 91.173 kg (201 lb)  BMI 34.48 kg/m2  SpO2 100%  FHT: 140bpm, moderate variablity, + accels, no decels Toco: q3-384min SVE: C/C/+1  Utox: +marijuana  A/P: 25 y.o. G3P0020 @ 3192w6d 1. FWB: Cat. I, FSE in place 2. Labor: expectant management Pain: continue with epidural GBS: negative -h/o drug use, +marijuana use  Myna HidalgoJennifer Jaiyden Laur, DO [phone] (pager) [phone] (office)

## 2015-08-28 NOTE — Anesthesia Pain Management Evaluation Note (Signed)
  CRNA Pain Management Visit Note  Patient: Sandy EhlersChristina Johnson, 25 y.o., female  "Hello I am a member of the anesthesia team at [facility]. We have an anesthesia team available at all times to provide care throughout the hospital, including epidural management and anesthesia for C-section. I don't know your plan for the delivery whether it a natural birth, water birth, IV sedation, nitrous supplementation, doula or epidural, but we want to meet your pain goals."   1.Was your pain managed to your expectations on prior hospitalizations?   No prior hospitalizations  2.What is your expectation for pain management during this hospitalization?     Labor support without medications  3.How can we help you reach that goal? Epidural  Record the patient's initial score and the patient's pain goal.   Pain: 0  Pain Goal: 3 [facility] wants you to be able to say your pain was always managed very well.  Sandy ShellingBURGER,Sandy Johnson 08/28/2015

## 2015-08-29 LAB — CBC
HCT: 31.5 % — ABNORMAL LOW (ref 36.0–46.0)
HEMOGLOBIN: 10.5 g/dL — AB (ref 12.0–15.0)
MCH: 27.5 pg (ref 26.0–34.0)
MCHC: 33.3 g/dL (ref 30.0–36.0)
MCV: 82.5 fL (ref 78.0–100.0)
PLATELETS: 151 10*3/uL (ref 150–400)
RBC: 3.82 MIL/uL — AB (ref 3.87–5.11)
RDW: 13.9 % (ref 11.5–15.5)
WBC: 13.2 10*3/uL — ABNORMAL HIGH (ref 4.0–10.5)

## 2015-08-29 NOTE — Anesthesia Postprocedure Evaluation (Signed)
Anesthesia Post Note  Patient: Sandy EhlersChristina Johnson  Procedure(s) Performed: * No procedures listed *  Patient location during evaluation: Mother Baby Anesthesia Type: Epidural Level of consciousness: awake Pain management: satisfactory to patient Vital Signs Assessment: post-procedure vital signs reviewed and stable Respiratory status: spontaneous breathing Cardiovascular status: stable Anesthetic complications: no     Last Vitals:  Filed Vitals:   08/28/15 2358 08/29/15 0500  BP: 102/59 119/86  Pulse: 100 97  Temp: 37 C 36.4 C  Resp: 18 18    Last Pain:  Filed Vitals:   08/29/15 1116  PainSc: 0-No pain   Pain Goal: Patients Stated Pain Goal: 0 (08/28/15 0615)               Cephus ShellingBURGER,Astou Lada

## 2015-08-29 NOTE — Progress Notes (Signed)
Postpartum Note Day # 1  S:  Patient resting comfortable in bed.  Pain controlled.  Tolerating general diet. + flatus, no BM.  Lochia appropriate.  Ambulating without difficulty.  She denies n/v/f/c, SOB, or CP.  Pt plans on breastfeeding.  O: Temp:  [97.5 F (36.4 C)-99.1 F (37.3 C)] 97.5 F (36.4 C) (06/18 0500) Pulse Rate:  [93-233] 97 (06/18 0500) Resp:  [16-18] 18 (06/18 0500) BP: (96-132)/(38-97) 119/86 mmHg (06/18 0500) SpO2:  [97 %-100 %] 100 % (06/18 0500) Weight:  [91.173 kg (201 lb)] 91.173 kg (201 lb) (06/17 0754)   Gen: A&Ox3, NAD CV: RRR, no MRG Resp: CTAB Abdomen: soft, NT, ND Uterus: firm, non-tender, below umbilicus Ext: No edema, no calf tenderness bilaterally  Labs:  Recent Labs  08/28/15 0603 08/29/15 0532  HGB 12.8 10.5*    A/P: Pt is a 25 y.o. [id number] s/p NSVD, PPD#1  - Pain well controlled -GU: UOP is adequate -GI: Tolerating general diet -Activity: encouraged sitting up to chair and ambulation as tolerated -Prophylaxis: early ambulation -Labs: appropriate following delivery, pt asymptomatic -Baby boy circ to be completed today  Pt meeting postpartum milestones appropriate, pending Peds possible discharge home today.  Myna HidalgoJennifer Milee Qualls, DO [phone] (pager) [phone] (office)

## 2015-08-30 MED ORDER — IBUPROFEN 600 MG PO TABS: 600.0000 mg | ORAL_TABLET | Freq: Four times a day (QID) | ORAL | Status: DC

## 2015-08-30 NOTE — Lactation Note (Signed)
This note was copied from a baby's chart. Lactation Consultation Note  Patient Name: Boy Neta EhlersChristina Osowski ZOXWR'UToday's Date: 08/30/2015 Reason for consult: Follow-up assessment;Hyperbilirubinemia   Follow up with mom of 51 hour old infant. Infant with 8 BF for 10-25 minutes, 3 voids and 4 stools in last 24 hours. LATCH Scores 10 by bedside RN. Infant weight 6 lb 4.5 oz with 7% weight loss since birth. Awaiting post phototherapy bilirubin to determine d/c home today.    Mom reports she is experiencing some burning type nipple pain past feed, she is using coconut oil to nipples. Reviewed BF basics and enc mom to support infant and maintain deep latch throughout feeding. Offered to observe a feeding, mom reports he just finished feeding for an hour. Advised mom that if nipple soreness worsens to please call LC.   Mom has an Ahmeda DEBP at home for use. She has made infant an op appt. Reviewed all BF information in Taking Care of Baby and Me Booklet. Reviewed Engorgement prevention/treatment, pre pumping and comfort pumping. Reviewed I/O and enc mom to maintain feeding log and take to Ped appt. Reviewed LC Brochure, mom aware of OP services, BF Support Groups and LC phone #. Enc mom to call with questions/concerns prn.   Maternal Data Formula Feeding for Exclusion: No Does the patient have breastfeeding experience prior to this delivery?: No  Feeding Feeding Type: Breast Fed Length of feed: 14 min  LATCH Score/Interventions                      Lactation Tools Discussed/Used     Consult Status Consult Status: Complete Follow-up type: Call as needed    Ed BlalockSharon S Matheo Rathbone 08/30/2015, 3:15 PM

## 2015-08-30 NOTE — Discharge Instructions (Signed)

## 2015-09-08 NOTE — Progress Notes (Signed)
CSW monitored cord screen.  CSW made report to CPS for positive cord screen.

## 2015-09-29 NOTE — Discharge Summary (Signed)
OB Discharge Summary     Patient Name: Sandy EhlersChristina Ryback DOB: 26-Jul-1990 MRN: [id number]  Date of admission: 08/28/2015 Delivering MD: Myna HidalgoZAN, JENNIFER   Date of discharge: 09/29/2015  Admitting diagnosis: 40 WEEKS - 1 DAY, IN LABOR Intrauterine pregnancy: 3789w6d     Secondary diagnosis:  Active Problems:   Normal labor   Hx of mixed drug abuse   Prior pregnancy with fetal demise  Additional problems: None     Discharge diagnosis: Term Pregnancy Delivered                                                                                                Post partum procedures:None  Augmentation: AROM and suspected SROM prior to AROM  Complications: None  Hospital course:  Onset of Labor With Vaginal Delivery     25 y.o. yo [id number] at 4289w6d was admitted in Active Labor on 08/28/2015. Patient had an uncomplicated labor course as follows:  Membrane Rupture Time/Date: 7:35 AM ,08/28/2015   Intrapartum Procedures: Episiotomy: Median [2];Left Mediolateral [3]                                         Lacerations:     Patient had a delivery of a Viable infant. 08/28/2015  Information for the patient's newborn:  Haze JustinButler, Mason Colt [[id number]][[id number]]  Delivery Method: Vaginal, Spontaneous Delivery (Filed from Delivery Summary)    Pateint had an uncomplicated postpartum course.  She is ambulating, tolerating a regular diet, passing flatus, and urinating well. Patient is discharged home in stable condition on 09/29/2015.    Physical exam  Filed Vitals:   08/28/15 2358 08/29/15 0500 08/29/15 1756 08/30/15 0600  BP: 102/59 119/86 103/57 105/59  Pulse: 100 97 109 85  Temp: 98.6 F (37 C) 97.5 F (36.4 C) 98.4 F (36.9 C) 97.6 F (36.4 C)  TempSrc: Oral Oral Oral Oral  Resp: 18 18 16 18   Height:      Weight:      SpO2: 100% 100% 99%    General: alert, cooperative and no distress Lochia: appropriate Uterine Fundus: firm Incision: N/A DVT Evaluation: No evidence of DVT seen on physical  exam. No cords or calf tenderness. Calf/Ankle edema is present Labs: Lab Results  Component Value Date   WBC 13.2* 08/29/2015   HGB 10.5* 08/29/2015   HCT 31.5* 08/29/2015   MCV 82.5 08/29/2015   PLT 151 08/29/2015   No flowsheet data found.  Discharge instruction: per After Visit Summary and "Baby and Me Booklet".  After visit meds:    Medication List    TAKE these medications        ibuprofen 600 MG tablet  Commonly known as:  ADVIL,MOTRIN  Take 1 tablet (600 mg total) by mouth every 6 (six) hours.     prenatal multivitamin Tabs tablet  Take 1 tablet by mouth at bedtime.        Diet: routine diet  Activity: Advance as tolerated. Pelvic rest for 6 weeks.   Outpatient follow  up:2 weeks Follow up Appt:No future appointments. Follow up Visit:No Follow-up on file.  Postpartum contraception: IUD pending  Newborn Data: Live born female  Birth Weight: 6 lb 12.3 oz (3070 g) APGAR: 7, 9  Baby Feeding: Breast Disposition:home with mother   09/29/2015 Geryl Rankins, MD

## 2016-07-02 ENCOUNTER — Ambulatory Visit (HOSPITAL_COMMUNITY)
Admission: EM | Admit: 2016-07-02 | Discharge: 2016-07-02 | Disposition: A | Discharge status: Home / Self Care | Payer: 59 | Attending: Family Medicine | Admitting: Family Medicine

## 2016-07-02 ENCOUNTER — Encounter (HOSPITAL_COMMUNITY): Payer: Self-pay | Admitting: Emergency Medicine

## 2016-07-02 DIAGNOSIS — S91331A Puncture wound without foreign body, right foot, initial encounter: Secondary | ICD-10-CM

## 2016-07-02 DIAGNOSIS — R21 Rash and other nonspecific skin eruption: Secondary | ICD-10-CM | POA: Diagnosis not present

## 2016-07-02 DIAGNOSIS — L309 Dermatitis, unspecified: Secondary | ICD-10-CM

## 2016-07-02 DIAGNOSIS — Z23 Encounter for immunization: Secondary | ICD-10-CM

## 2016-07-02 MED ORDER — TETANUS-DIPHTH-ACELL PERTUSSIS 5-2.5-18.5 LF-MCG/0.5 IM SUSP
INTRAMUSCULAR | Status: AC
  Filled 2016-07-02: qty 0.5

## 2016-07-02 MED ORDER — TETANUS-DIPHTH-ACELL PERTUSSIS 5-2.5-18.5 LF-MCG/0.5 IM SUSP
0.5000 mL | Freq: Once | INTRAMUSCULAR | Status: AC
  Administered 2016-07-02: 0.5 mL via INTRAMUSCULAR

## 2016-07-02 MED ORDER — AMOXICILLIN-POT CLAVULANATE 875-125 MG PO TABS: 1.0000 | ORAL_TABLET | Freq: Two times a day (BID) | ORAL | 0 refills | Status: DC

## 2016-07-02 MED ORDER — TRIAMCINOLONE ACETONIDE 0.1 % EX CREA: 1.0000 "application " | TOPICAL_CREAM | Freq: Two times a day (BID) | CUTANEOUS | 0 refills | Status: DC

## 2016-07-02 NOTE — ED Provider Notes (Addendum)
[facility]    CSN: [id number] Arrival date & time: 07/02/16  1754     History   Chief Complaint Chief Complaint  Patient presents with  . Puncture Wound    HPI Sandy Johnson is a 26 y.o. female.   Stepped on a rusty nail approx one hour ago.  Unknown when last tetanus.  Patient washed the area thoroughly with peroxide and then soap and water.  Patient was walking around her yard when she stepped on a rusty nail that went through her flip-flops. She has mild amount of pain currently. She works on her feet at a [employer] facility. She's off tomorrow but then has to go back full-time on Tuesday.  Patient also has a very itchy rash on her breasts, forearms, hands and back.  OTC Cortisone cream has not helped.  Her husband and child do not have this.      Past Medical History:  Diagnosis Date  . Anxiety   . RLS (restless legs syndrome)     Patient Active Problem List   Diagnosis Date Noted  . Normal labor 08/28/2015  . Hx of mixed drug abuse 08/28/2015  . Prior pregnancy with fetal demise 08/28/2015    Past Surgical History:  Procedure Laterality Date  . TONSILLECTOMY    . WISDOM TOOTH EXTRACTION      OB History    Gravida Para Term Preterm AB Living   SAB TAB Ectopic Multiple Live Births   1 1   0 1       Home Medications    Prior to Admission medications   Medication Sig Start Date End Date Taking? Authorizing Provider  levonorgestrel-ethinyl estradiol (AVIANE,ALESSE,LESSINA) 0.1-20 MG-MCG tablet Take 1 tablet by mouth daily.   Yes Historical Provider, MD  amoxicillin-clavulanate (AUGMENTIN) 875-125 MG tablet Take 1 tablet by mouth every 12 (twelve) hours. 07/02/16   Elvina Sidle, MD  ibuprofen (ADVIL,MOTRIN) 600 MG tablet Take 1 tablet (600 mg total) by mouth every 6 (six) hours. 08/30/15   Geryl Rankins, MD  Prenatal Vit-Fe Fumarate-FA (PRENATAL MULTIVITAMIN) TABS tablet Take 1 tablet by mouth at bedtime.     Historical  Provider, MD  triamcinolone cream (KENALOG) 0.1 % Apply 1 application topically 2 (two) times daily. 07/02/16   Elvina Sidle, MD    Family History Family History  Problem Relation Age of Onset  . Diabetes Maternal Grandfather   . Diabetes Father   . Hypertension Father   . Heart disease Father   . Diabetes Maternal Grandmother   . Heart disease Paternal Grandfather     Social History Social History  Substance Use Topics  . Smoking status: Former Games developer  . Smokeless tobacco: Never Used  . Alcohol use No     Allergies   Codeine   Review of Systems Review of Systems  Constitutional: Negative.   Respiratory: Negative.   Cardiovascular: Negative.   Gastrointestinal: Negative.   Skin: Positive for rash and wound.     Physical Exam Triage Vital Signs ED Triage Vitals [07/02/16 1806]  Enc Vitals Group     BP      Pulse      Resp      Temp      Temp src      SpO2      Weight      Height      Head Circumference      Peak Flow  Pain Score 7     Pain Loc      Pain Edu?      Excl. in GC?    No data found.   Updated Vital Signs BP 123/84 (BP Location: Left Arm)   Pulse 96   Temp 98.1 F (36.7 C) (Oral)   Resp 18   LMP 07/02/2016   SpO2 96%    Physical Exam  Constitutional: She is oriented to person, place, and time. She appears well-developed and well-nourished.  HENT:  Right Ear: External ear normal.  Left Ear: External ear normal.  Mouth/Throat: Oropharynx is clear and moist.  Eyes: Conjunctivae and EOM are normal. Pupils are equal, round, and reactive to light.  Neck: Normal range of motion.  Pulmonary/Chest: Effort normal.  Musculoskeletal: Normal range of motion.  Neurological: She is alert and oriented to person, place, and time.  Skin: Skin is warm and dry. No erythema.  Small puncture wound on right heel  Eczematous rash on wrists, dorsal hands, web spaces, upper chest, and back  Psychiatric: She has a normal mood and affect. Her  behavior is normal. Thought content normal.  Nursing note and vitals reviewed.    UC Treatments / Results  Labs (all labs ordered are listed, but only abnormal results are displayed) Labs Reviewed - No data to display  EKG  EKG Interpretation None       Radiology No results found.  Procedures Procedures (including critical care time)  Medications Ordered in UC Medications  Tdap (BOOSTRIX) injection 0.5 mL (0.5 mLs Intramuscular Given 07/02/16 1826)     Initial Impression / Assessment and Plan / UC Course  I have reviewed the triage vital signs and the nursing notes.  Pertinent labs & imaging results that were available during my care of the patient were reviewed by me and considered in my medical decision making (see chart for details).     Final Clinical Impressions(s) / UC Diagnoses   Final diagnoses:  Puncture wound of right foot, initial encounter  Eczema, unspecified type    New Prescriptions New Prescriptions   AMOXICILLIN-CLAVULANATE (AUGMENTIN) 875-125 MG TABLET    Take 1 tablet by mouth every 12 (twelve) hours.   TRIAMCINOLONE CREAM (KENALOG) 0.1 %    Apply 1 application topically 2 (two) times daily.     Elvina Sidle, MD 07/02/16 Rickey Primus    Elvina Sidle, MD 07/02/16 [phone]

## 2016-07-02 NOTE — ED Triage Notes (Signed)
Stepped on a rusty nail approx one hour ago.  Unknown when last tetanus

## 2016-10-20 DIAGNOSIS — R102 Pelvic and perineal pain: Secondary | ICD-10-CM | POA: Diagnosis not present

## 2016-10-20 DIAGNOSIS — N921 Excessive and frequent menstruation with irregular cycle: Secondary | ICD-10-CM | POA: Diagnosis not present

## 2016-11-07 DIAGNOSIS — Z01419 Encounter for gynecological examination (general) (routine) without abnormal findings: Secondary | ICD-10-CM | POA: Diagnosis not present

## 2017-05-02 DIAGNOSIS — J111 Influenza due to unidentified influenza virus with other respiratory manifestations: Secondary | ICD-10-CM | POA: Diagnosis not present

## 2017-07-11 DIAGNOSIS — K529 Noninfective gastroenteritis and colitis, unspecified: Secondary | ICD-10-CM | POA: Diagnosis not present

## 2017-08-20 DIAGNOSIS — N760 Acute vaginitis: Secondary | ICD-10-CM | POA: Diagnosis not present

## 2017-09-06 DIAGNOSIS — K047 Periapical abscess without sinus: Secondary | ICD-10-CM | POA: Diagnosis not present

## 2017-09-17 ENCOUNTER — Encounter (HOSPITAL_COMMUNITY): Payer: Self-pay | Admitting: *Deleted

## 2017-09-17 ENCOUNTER — Ambulatory Visit (HOSPITAL_COMMUNITY)
Admission: EM | Admit: 2017-09-17 | Discharge: 2017-09-17 | Disposition: A | Discharge status: Home / Self Care | Payer: 59 | Attending: Urgent Care | Admitting: Urgent Care

## 2017-09-17 ENCOUNTER — Other Ambulatory Visit: Payer: Self-pay

## 2017-09-17 DIAGNOSIS — M79605 Pain in left leg: Secondary | ICD-10-CM

## 2017-09-17 DIAGNOSIS — T24209A Burn of second degree of unspecified site of unspecified lower limb, except ankle and foot, initial encounter: Secondary | ICD-10-CM | POA: Diagnosis not present

## 2017-09-17 MED ORDER — SILVER SULFADIAZINE 1 % EX CREA: 1.0000 "application " | TOPICAL_CREAM | Freq: Every day | CUTANEOUS | 0 refills | Status: DC
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
## 2017-09-17 NOTE — Discharge Instructions (Addendum)
Change your dressings daily after you shower. Gently clean your wound each day. Do not rip or tear any of the eschar.
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
## 2018-01-14 DIAGNOSIS — L209 Atopic dermatitis, unspecified: Secondary | ICD-10-CM | POA: Diagnosis not present

## 2018-04-02 ENCOUNTER — Ambulatory Visit (INDEPENDENT_AMBULATORY_CARE_PROVIDER_SITE_OTHER): Payer: 59 | Admitting: Family Medicine

## 2018-04-02 ENCOUNTER — Encounter (INDEPENDENT_AMBULATORY_CARE_PROVIDER_SITE_OTHER): Payer: Self-pay

## 2018-04-02 ENCOUNTER — Encounter: Payer: Self-pay | Admitting: Family Medicine

## 2018-04-02 VITALS — BP 126/74 | HR 84 | Temp 98.3°F | Ht 63.0 in | Wt 194.8 lb

## 2018-04-02 DIAGNOSIS — F1290 Cannabis use, unspecified, uncomplicated: Secondary | ICD-10-CM | POA: Diagnosis not present

## 2018-04-02 DIAGNOSIS — E669 Obesity, unspecified: Secondary | ICD-10-CM | POA: Diagnosis not present

## 2018-04-02 DIAGNOSIS — F40243 Fear of flying: Secondary | ICD-10-CM

## 2018-04-02 DIAGNOSIS — Z3009 Encounter for other general counseling and advice on contraception: Secondary | ICD-10-CM | POA: Diagnosis not present

## 2018-04-02 MED ORDER — ALPRAZOLAM 0.25 MG PO TABS: 0.2500 mg | ORAL_TABLET | Freq: Three times a day (TID) | ORAL | 0 refills | Status: DC | PRN

## 2018-04-02 MED ORDER — NUVARING 0.12-0.015 MG/24HR VA RING: VAGINAL_RING | VAGINAL | 11 refills | Status: AC

## 2018-04-02 NOTE — Assessment & Plan Note (Signed)
Congrats on weight loss and diet changes. Encouraged decreasing use of hydroxycut as it is likely impacting sleep.

## 2018-04-02 NOTE — Assessment & Plan Note (Signed)
Discussed that marijuana can cause worsening insomnia, anxiety, and mood disorders. Pt aware of some of the risks involved but feels this helps and she is still able to perform typically jobs and responsibilities

## 2018-04-02 NOTE — Progress Notes (Signed)
Subjective:     Sandy Johnson is a 28 y.o. female presenting for Establish Care (moved from [place] to [facility] in July 2016. Was going to GYN.) and Annual Exam (Nuvaring refilled. and would like a Rx to help with anxiety when flying.)     HPI   #Birth control - using the nuvaring continuously - not concerned about pregnancy - last period was 4 months - working well for her - was doing depo and low sex drive - typically changes the beginning of each month - under a lot of stress right now - took a test about 1 month ago and it was negative   #weight loss - down 12 lbs in the last few weeks - taking hydroxycut - reduced calories - joined a gym but difficult to find time to go  #Anxiety - against pills - treats with marijuana primarily - certain things seem to bother her - like going to the gym - no panic attacks  #Flying anxiety - Leaving this Saturday to go home - bringing [employer] with her on the trip - worried about turbulence - will ask a friend for medication     Review of Systems  Constitutional: Negative for chills and fever.  Respiratory: Negative for shortness of breath and wheezing.   Genitourinary: Negative for vaginal bleeding.     Social History   Tobacco Use  Smoking Status Former Smoker  . Packs/day: 0.25  . Years: 5.00  . Pack years: 1.25  . Types: Cigarettes  . Last attempt to quit: 03/13/2012  . Years since quitting: 6.0  Smokeless Tobacco Never Used  Tobacco Comment   was very occasional        Objective:    BP Readings from Last 3 Encounters:  04/02/18 126/74  09/17/17 119/67  07/02/16 123/84   Wt Readings from Last 3 Encounters:  04/02/18 194 lb 12 oz (88.3 kg)  08/28/15 201 lb (91.2 kg)  06/15/15 191 lb (86.6 kg)    BP 126/74   Pulse 84   Temp 98.3 F (36.8 C)   Ht 5\' 3"  (1.6 m)   Wt 194 lb 12 oz (88.3 kg)   LMP 12/01/2017   SpO2 98%   BMI 34.50 kg/m    Physical Exam Constitutional:      General: She is not  in acute distress.    Appearance: She is well-developed. She is not diaphoretic.  HENT:     Right Ear: External ear normal.     Left Ear: External ear normal.     Nose: Nose normal.  Eyes:     Conjunctiva/sclera: Conjunctivae normal.  Neck:     Musculoskeletal: Neck supple.  Cardiovascular:     Rate and Rhythm: Normal rate.  Pulmonary:     Effort: Pulmonary effort is normal.  Skin:    General: Skin is warm and dry.     Capillary Refill: Capillary refill takes less than 2 seconds.  Neurological:     Mental Status: She is alert. Mental status is at baseline.  Psychiatric:        Mood and Affect: Mood normal.        Behavior: Behavior normal.           Assessment & Plan:   Problem List Items Addressed This Visit      Other   Anxiety with flying - Primary    Elected to prescribe 4 tablets of xanax for this upcoming flight. Instructed that this could make her sleepy.  Relevant Medications   ALPRAZolam (XANAX) 0.25 MG tablet   Marijuana use    Discussed that marijuana can cause worsening insomnia, anxiety, and mood disorders. Pt aware of some of the risks involved but feels this helps and she is still able to perform typically jobs and responsibilities       Other Visit Diagnoses    Birth control counseling       Relevant Medications   NUVARING 0.12-0.015 MG/24HR vaginal ring     As she has recently had a negative pregnancy test and is continuing to the use the nuvaring did not test today. Advised having a break from continuous use every 3 months.   Return for annual.  Lynnda ChildJessica R Dmarius Reeder, MD

## 2018-04-02 NOTE — Assessment & Plan Note (Signed)
Elected to prescribe 4 tablets of xanax for this upcoming flight. Instructed that this could make her sleepy.

## 2018-04-02 NOTE — Patient Instructions (Signed)
#   anxiety for flying - try xanax  - if not working, then you can take a second dose

## 2018-04-05 ENCOUNTER — Encounter: Payer: Self-pay | Admitting: Obstetrics and Gynecology

## 2018-04-30 DIAGNOSIS — J069 Acute upper respiratory infection, unspecified: Secondary | ICD-10-CM | POA: Diagnosis not present

## 2018-05-09 ENCOUNTER — Other Ambulatory Visit (HOSPITAL_COMMUNITY)
Admission: RE | Admit: 2018-05-09 | Discharge: 2018-05-09 | Disposition: A | Discharge status: Home / Self Care | Payer: 59 | Source: Ambulatory Visit | Attending: Family Medicine | Admitting: Family Medicine

## 2018-05-09 ENCOUNTER — Ambulatory Visit (INDEPENDENT_AMBULATORY_CARE_PROVIDER_SITE_OTHER): Payer: 59 | Admitting: Family Medicine

## 2018-05-09 ENCOUNTER — Encounter: Payer: Self-pay | Admitting: Family Medicine

## 2018-05-09 VITALS — BP 112/72 | HR 100 | Temp 97.9°F | Ht 63.0 in | Wt 193.5 lb

## 2018-05-09 DIAGNOSIS — E669 Obesity, unspecified: Secondary | ICD-10-CM | POA: Diagnosis not present

## 2018-05-09 DIAGNOSIS — Z0000 Encounter for general adult medical examination without abnormal findings: Secondary | ICD-10-CM | POA: Diagnosis present

## 2018-05-09 DIAGNOSIS — Z833 Family history of diabetes mellitus: Secondary | ICD-10-CM

## 2018-05-09 DIAGNOSIS — R635 Abnormal weight gain: Secondary | ICD-10-CM | POA: Diagnosis not present

## 2018-05-09 NOTE — Patient Instructions (Signed)
For weight loss 1) Make sure you are eating enough "good" calories -- fruit, veggies, lean meat and whole grains 2) Avoid sugary beverages 3) Get moderate exercise 3-5 times a week   Preventive Care 18-39 Years, Female Preventive care refers to lifestyle choices and visits with your health care provider that can promote health and wellness. What does preventive care include?   A yearly physical exam. This is also called an annual well check.  Dental exams once or twice a year.  Routine eye exams. Ask your health care provider how often you should have your eyes checked.  Personal lifestyle choices, including: ? Daily care of your teeth and gums. ? Regular physical activity. ? Eating a healthy diet. ? Avoiding tobacco and drug use. ? Limiting alcohol use. ? Practicing safe sex. ? Taking vitamin and mineral supplements as recommended by your health care provider. What happens during an annual well check? The services and screenings done by your health care provider during your annual well check will depend on your age, overall health, lifestyle risk factors, and family history of disease. Counseling Your health care provider may ask you questions about your:  Alcohol use.  Tobacco use.  Drug use.  Emotional well-being.  Home and relationship well-being.  Sexual activity.  Eating habits.  Work and work Statistician.  Method of birth control.  Menstrual cycle.  Pregnancy history. Screening You may have the following tests or measurements:  Height, weight, and BMI.  Diabetes screening. This is done by checking your blood sugar (glucose) after you have not eaten for a while (fasting).  Blood pressure.  Lipid and cholesterol levels. These may be checked every 5 years starting at age 40.  Skin check.  Hepatitis C blood test.  Hepatitis B blood test.  Sexually transmitted disease (STD) testing.  BRCA-related cancer screening. This may be done if you have a  family history of breast, ovarian, tubal, or peritoneal cancers.  Pelvic exam and Pap test. This may be done every 3 years starting at age 18. Starting at age 34, this may be done every 5 years if you have a Pap test in combination with an HPV test. Discuss your test results, treatment options, and if necessary, the need for more tests with your health care provider. Vaccines Your health care provider may recommend certain vaccines, such as:  Influenza vaccine. This is recommended every year.  Tetanus, diphtheria, and acellular pertussis (Tdap, Td) vaccine. You may need a Td booster every 10 years.  Varicella vaccine. You may need this if you have not been vaccinated.  HPV vaccine. If you are 30 or younger, you may need three doses over 6 months.  Measles, mumps, and rubella (MMR) vaccine. You may need at least one dose of MMR. You may also need a second dose.  Pneumococcal 13-valent conjugate (PCV13) vaccine. You may need this if you have certain conditions and were not previously vaccinated.  Pneumococcal polysaccharide (PPSV23) vaccine. You may need one or two doses if you smoke cigarettes or if you have certain conditions.  Meningococcal vaccine. One dose is recommended if you are age 17-21 years and a first-year college student living in a residence hall, or if you have one of several medical conditions. You may also need additional booster doses.  Hepatitis A vaccine. You may need this if you have certain conditions or if you travel or work in places where you may be exposed to hepatitis A.  Hepatitis B vaccine. You may need this  if you have certain conditions or if you travel or work in places where you may be exposed to hepatitis B.  Haemophilus influenzae type b (Hib) vaccine. You may need this if you have certain risk factors. Talk to your health care provider about which screenings and vaccines you need and how often you need them. This information is not intended to replace  advice given to you by your health care provider. Make sure you discuss any questions you have with your health care provider. Document Released: 04/25/2001 Document Revised: 10/10/2016 Document Reviewed: 12/29/2014 Elsevier Interactive Patient Education  2019 [employer].

## 2018-05-09 NOTE — Progress Notes (Signed)
Annual Exam   Chief Complaint:  Chief Complaint  Patient presents with  . Annual Exam    with pap smear    History of Present Illness:  [name] is a 28 y.o. [phone] who LMP was No LMP recorded., presents today for her annual examination.     Using the nuva ring continously. Does endorse some breast tenderness bloating. She does not have intermenstrual bleeding.  She is single partner, contraception - NuvaRing vaginal inserts.  Last Pap:   December 2016 Results were: no abnormalities /neg HPV DNA Hx of STDs: none  There is FH of breast cancer. There is no FH of ovarian cancer.   She does get adequate calcium and Vitamin D in her diet.  The patient wears seatbelts: yes.      Past Medical History:  Diagnosis Date  . Anxiety   . History of chicken pox   . Prior pregnancy with fetal demise 08/28/2015   H/o fetal demise at 20 weeks   . RLS (restless legs syndrome)     Past Surgical History:  Procedure Laterality Date  . TONSILLECTOMY    . WISDOM TOOTH EXTRACTION      Prior to Admission medications   Medication Sig Start Date End Date Taking? Authorizing Provider  ALPRAZolam (XANAX) 0.25 MG tablet Take 1 tablet (0.25 mg total) by mouth 3 (three) times daily as needed for anxiety (for flying anxiety). 04/02/18  Yes Lynnda Child, MD  Biotin 1000 MCG tablet Take 1,000 mcg by mouth daily.   Yes [provider]  NUVARING 0.12-0.015 MG/24HR vaginal ring Insert vaginally and leave in place for 3 consecutive weeks, then remove for 1 week. 04/02/18  Yes Lynnda Child, MD  OVER THE COUNTER MEDICATION HYDROXYCUT OTC   Yes [provider]  triamcinolone cream (KENALOG) 0.1 % Apply 1 application topically 2 (two) times daily. 07/02/16  Yes Elvina Sidle, MD    Allergies  Allergen Reactions  . Codeine Anaphylaxis    Gynecologic History: No LMP recorded.  Obstetric History: [id number]  Social History   Socioeconomic History  . Marital status:  Married    Spouse name: Reuel Boom  . Number of children: 1  . Years of education: High school  . Highest education level: Not on file  Occupational History  . Not on file  Social Needs  . Financial resource strain: Not hard at all  . Food insecurity:    Worry: Not on file    Inability: Not on file  . Transportation needs:    Medical: Not on file    Non-medical: Not on file  Tobacco Use  . Smoking status: Former Smoker    Packs/day: 0.25    Years: 5.00    Pack years: 1.25    Types: Cigarettes    Last attempt to quit: 03/13/2012    Years since quitting: 6.1  . Smokeless tobacco: Never Used  . Tobacco comment: was very occasional  Substance and Sexual Activity  . Alcohol use: Not Currently  . Drug use: Yes    Types: Marijuana    Comment: hx of cocaine abuse - clean 5 years, marijuana - more than once a day  . Sexual activity: Yes    Birth control/protection: Other-see comments    Comment: Nuvaring - 1 partner  Lifestyle  . Physical activity:    Days per week: Not on file    Minutes per session: Not on file  . Stress: Not on file  Relationships  .  Social connections:    Talks on phone: Not on file    Gets together: Not on file    Attends religious service: Not on file    Active member of club or organization: Not on file    Attends meetings of clubs or organizations: Not on file    Relationship status: Not on file  . Intimate partner violence:    Fear of current or ex partner: Not on file    Emotionally abused: Not on file    Physically abused: Not on file    Forced sexual activity: Not on file  Other Topics Concern  . 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 Diabetes Maternal Grandfather   .  Diabetes Father   . Hypertension Father   . Heart disease Father   . Hyperlipidemia Father   . Heart attack Father 77  . Diabetes Maternal Grandmother   . Breast cancer Maternal Grandmother   . Heart disease Paternal Grandfather   . Depression Sister   . Drug abuse Sister   . Hyperlipidemia Sister   . Hypertension Sister   . Miscarriages / Stillbirths Sister   . Drug abuse Brother        remission now    Review of Systems  Constitutional: Negative for chills and fever.  HENT: Negative for congestion, sinus pain and sore throat.   Eyes: Negative for blurred vision and double vision.  Respiratory: Negative for cough and shortness of breath.   Cardiovascular: Negative for chest pain, palpitations and leg swelling.  Gastrointestinal: Negative for abdominal pain, constipation, diarrhea and heartburn.  Genitourinary: Negative.   Musculoskeletal: Negative for myalgias.  Skin: Positive for itching. Negative for rash.  Neurological: Negative for weakness and headaches.  Endo/Heme/Allergies: Negative.   Psychiatric/Behavioral: The patient has insomnia.      Physical Exam BP 112/72   Pulse 100   Temp 97.9 F (36.6 C)   Ht 5\' 3"  (1.6 m)   Wt 193 lb 8 oz (87.8 kg)   BMI 34.28 kg/m    BP Readings from Last 3 Encounters:  05/09/18 112/72  04/02/18 126/74  09/17/17 119/67    Wt Readings from Last 3 Encounters:  05/09/18 193 lb 8 oz (87.8 kg)  04/02/18 194 lb 12 oz (88.3 kg)  08/28/15 201 lb (91.2 kg)     Physical Exam Exam conducted with a chaperone present.  Constitutional:      General: She is not in acute distress.    Appearance: She is well-developed. She is not diaphoretic.  HENT:     Head: Normocephalic and atraumatic.     Right Ear: External ear normal.     Left Ear: External ear normal.     Nose: Nose normal.  Eyes:     General: No scleral icterus.    Conjunctiva/sclera: Conjunctivae normal.  Neck:     Musculoskeletal: Neck supple.  Cardiovascular:      Rate and Rhythm: Normal rate and regular rhythm.     Heart sounds: No murmur.  Pulmonary:     Effort: Pulmonary effort is normal. No respiratory distress.     Breath sounds: Normal breath sounds. No  wheezing.  Abdominal:     General: Bowel sounds are normal. There is no distension.     Palpations: Abdomen is soft. There is no mass.     Tenderness: There is no abdominal tenderness. There is no guarding or rebound.  Genitourinary:    General: Normal vulva.     Vagina: Normal.     Cervix: Discharge (white normal ) and erythema (around the os) present. No cervical bleeding.  Musculoskeletal: Normal range of motion.  Lymphadenopathy:     Cervical: No cervical adenopathy.  Skin:    General: Skin is warm and dry.     Capillary Refill: Capillary refill takes less than 2 seconds.  Neurological:     Mental Status: She is alert and oriented to person, place, and time.     Deep Tendon Reflexes: Reflexes normal.  Psychiatric:        Behavior: Behavior normal.      Female chaperone present for pelvic and breast  portions of the physical exam  Results:   Office Visit from 04/02/2018 in [facility] at [facility]  PHQ-9 Total Score  2        Assessment: 28 y.o. [id number] female here for routine annual gynecologic examination.  Plan: Problem List Items Addressed This Visit    None      Screening: -- Blood pressure screen normal -- Mammogram - not due -- Weight screening: obese: discussed management options, including lifestyle, dietary, and exercise. -- Depression screening:    Office Visit from 04/02/2018 in [facility] at [facility]  PHQ-9 Total Score  2     -- Nutrition: normal -- cholesterol screening: will obtain -- osteoporosis screening: not due -- tobacco screening: not using -- alcohol screening: AUDIT questionnaire indicates low-risk usage. -- family history of breast cancer screening: done. not at high risk. -- no evidence of domestic violence or  intimate partner violence. -- pap smear collected per ASCCP guidelines -- flu vaccine up to date -- HPV vaccination series: received  Lynnda Child

## 2018-05-10 LAB — LIPID PANEL
CHOL/HDL RATIO: 3
Cholesterol: 171 mg/dL (ref 0–200)
HDL: 49.5 mg/dL (ref >39.00)
LDL Cholesterol: 94 mg/dL (ref 0–99)
NONHDL: 121.02
Triglycerides: 136 mg/dL (ref 0.0–149.0)
VLDL: 27.2 mg/dL (ref 0.0–40.0)

## 2018-05-10 LAB — TSH: TSH: 1.19 u[IU]/mL (ref 0.35–4.50)

## 2018-05-10 LAB — HEMOGLOBIN A1C: HEMOGLOBIN A1C: 5.4 % (ref 4.6–6.5)

## 2018-05-13 LAB — CYTOLOGY - PAP: Diagnosis: NEGATIVE

## 2018-06-19 ENCOUNTER — Other Ambulatory Visit: Payer: Self-pay | Admitting: Family Medicine

## 2018-06-19 MED ORDER — TRIAMCINOLONE ACETONIDE 0.1 % EX CREA: 1.0000 "application " | TOPICAL_CREAM | Freq: Two times a day (BID) | CUTANEOUS | 0 refills | Status: AC

## 2018-06-19 NOTE — Telephone Encounter (Signed)
Last office visit 05/09/2018 for CPE.  Last refilled 07/02/2016 for 80 g with no refills by Dr. Milus Glazier.  Ok to refill?

## 2018-06-19 NOTE — Telephone Encounter (Signed)
Pt need a refill for Triamcinolone cream that was prescribed by someone else.   Sent to [facility]/[address]

## 2018-09-02 ENCOUNTER — Encounter: Payer: Self-pay | Admitting: Family Medicine

## 2018-09-05 ENCOUNTER — Other Ambulatory Visit: Payer: Self-pay

## 2018-09-05 ENCOUNTER — Ambulatory Visit (INDEPENDENT_AMBULATORY_CARE_PROVIDER_SITE_OTHER): Payer: 59 | Admitting: Family Medicine

## 2018-09-05 VITALS — BP 98/68 | HR 84 | Temp 98.1°F | Ht 63.0 in | Wt 179.8 lb

## 2018-09-05 DIAGNOSIS — F419 Anxiety disorder, unspecified: Secondary | ICD-10-CM | POA: Insufficient documentation

## 2018-09-05 DIAGNOSIS — R5383 Other fatigue: Secondary | ICD-10-CM

## 2018-09-05 DIAGNOSIS — N898 Other specified noninflammatory disorders of vagina: Secondary | ICD-10-CM

## 2018-09-05 LAB — FOLLICLE STIMULATING HORMONE: FSH: 4.9 m[IU]/mL

## 2018-09-05 LAB — TSH: TSH: 0.57 u[IU]/mL (ref 0.35–4.50)

## 2018-09-05 LAB — POCT URINE PREGNANCY: Preg Test, Ur: NEGATIVE

## 2018-09-05 LAB — VITAMIN D 25 HYDROXY (VIT D DEFICIENCY, FRACTURES): VITD: 60.15 ng/mL (ref 30.00–100.00)

## 2018-09-05 NOTE — Assessment & Plan Note (Signed)
Pertaining to wearing mask during Covid-19. Provided work note to allow for more frequent breaks to step outside and remove mask. Discussed importance of wearing mask while at work due to high risk profession

## 2018-09-05 NOTE — Patient Instructions (Signed)
We will get blood work today to see what could be impacting the fatigue and dryness.   If everything is normal, then I will put in a referral to OB/GYN to determine options for management.

## 2018-09-05 NOTE — Assessment & Plan Note (Signed)
May be related to diet changes. Will also get some basic blood work given vaginal dryness complaint as well.

## 2018-09-05 NOTE — Assessment & Plan Note (Signed)
On Nuvaring which to my knowledge only has one dose option, but with break through bleeding and vaginal dryness. Will assess for early menopause but advised OB/GYN visit if labs are normal and still wanting to pursue additional estrogen.

## 2018-09-05 NOTE — Progress Notes (Signed)
Subjective:     Sandy Johnson is a 28 y.o. female presenting for Discuss getting note not having to wear a mask all the time and vaginal dryness (and also fatigue. 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 - mask is making it hard for her to breathe doing this job - store is limited in number due to space - does not directly interact with customers but is on the main floor  #Vaginal Dryness - newly wed - not the same since having a child - currently doing the keto diet - is noticing some periods that are random since the diet - is getting spotting during nuva ring in place - has noticed some vaginal dryness - gets reactions to lubrications - drinks a lot of water and Gatorade zero throughout the day - feels she is overall well hydrated - is doing typical foreplay - is having sex more often   Review of Systems  Constitutional: Negative for fatigue.  HENT: Positive for dental problem.   Gastrointestinal: Negative for abdominal pain, diarrhea, nausea and vomiting.  Endocrine: Positive for cold intolerance. Negative for polydipsia and polyuria.  Genitourinary: Positive for menstrual problem and vaginal bleeding. Negative for difficulty urinating, vaginal discharge and vaginal pain.  Musculoskeletal: Negative for arthralgias and myalgias.  Neurological: Negative for dizziness, light-headedness and numbness.  Hematological: Negative for adenopathy.     Social History   Tobacco Use  Smoking Status Former Smoker  . Packs/day: 0.25  . Years: 5.00  . Pack years: 1.25  . Types: Cigarettes  . Quit date: 03/13/2012  . Years since quitting: 6.4  Smokeless Tobacco Never Used  Tobacco Comment   was very occasional        Objective:    BP Readings from Last 3 Encounters:  09/05/18 98/68  05/09/18 112/72  04/02/18 126/74   Wt Readings from Last 3 Encounters:  09/05/18 179 lb 12 oz  (81.5 kg)  05/09/18 193 lb 8 oz (87.8 kg)  04/02/18 194 lb 12 oz (88.3 kg)    BP 98/68   Pulse 84   Temp 98.1 F (36.7 C)   Ht 5\' 3"  (1.6 m)   Wt 179 lb 12 oz (81.5 kg)   SpO2 97%   BMI 31.84 kg/m    Physical Exam Constitutional:      General: She is not in acute distress.    Appearance: She is well-developed. She is not diaphoretic.  HENT:     Right Ear: External ear normal.     Left Ear: External ear normal.     Nose: Nose normal.  Eyes:     Conjunctiva/sclera: Conjunctivae normal.  Neck:     Musculoskeletal: Neck supple.  Cardiovascular:     Rate and Rhythm: Normal rate and regular rhythm.     Heart sounds: No murmur.  Pulmonary:     Effort: Pulmonary effort is normal. No respiratory distress.     Breath sounds: Normal breath sounds. No wheezing.  Abdominal:     General: Abdomen is flat. Bowel sounds are normal. There is no distension.     Tenderness: There is no abdominal tenderness. There is no guarding or rebound.  Skin:    General: Skin is warm and dry.     Capillary Refill: Capillary refill takes less than 2 seconds.  Neurological:     Mental Status: She is alert. Mental  status is at baseline.  Psychiatric:        Mood and Affect: Mood normal.        Behavior: Behavior normal.           Assessment & Plan:   Problem List Items Addressed This Visit      Genitourinary   Vaginal dryness - Primary    On Nuvaring which to my knowledge only has one dose option, but with break through bleeding and vaginal dryness. Will assess for early menopause but advised OB/GYN visit if labs are normal and still wanting to pursue additional estrogen.       Relevant Orders   POCT urine pregnancy (Completed)   TSH   Vitamin D, 25-hydroxy   Prolactin   Follicle stimulating hormone   Estrogens, Total     Other   Other fatigue    May be related to diet changes. Will also get some basic blood work given vaginal dryness complaint as well.       Relevant Orders    POCT urine pregnancy (Completed)   TSH   Vitamin D, 25-hydroxy   Anxiety    Pertaining to wearing mask during Covid-19. Provided work note to allow for more frequent breaks to step outside and remove mask. Discussed importance of wearing mask while at work due to high risk profession          Return if symptoms worsen or fail to improve.  Lesleigh Noe, MD

## 2018-09-09 ENCOUNTER — Encounter: Payer: Self-pay | Admitting: Family Medicine

## 2018-09-09 ENCOUNTER — Other Ambulatory Visit: Payer: Self-pay | Admitting: Family Medicine

## 2018-09-09 DIAGNOSIS — N898 Other specified noninflammatory disorders of vagina: Secondary | ICD-10-CM

## 2018-09-09 DIAGNOSIS — Z3009 Encounter for other general counseling and advice on contraception: Secondary | ICD-10-CM

## 2018-09-09 LAB — PROLACTIN: Prolactin: 2.9 ng/mL

## 2018-09-09 LAB — ESTROGENS, TOTAL: Estrogen: 361.3 pg/mL

## 2018-11-11 ENCOUNTER — Ambulatory Visit (INDEPENDENT_AMBULATORY_CARE_PROVIDER_SITE_OTHER): Payer: 59 | Admitting: Internal Medicine

## 2018-11-11 ENCOUNTER — Encounter: Payer: Self-pay | Admitting: Internal Medicine

## 2018-11-11 VITALS — Temp 97.9°F

## 2018-11-11 DIAGNOSIS — L089 Local infection of the skin and subcutaneous tissue, unspecified: Secondary | ICD-10-CM

## 2018-11-11 DIAGNOSIS — F40243 Fear of flying: Secondary | ICD-10-CM

## 2018-11-11 DIAGNOSIS — B9689 Other specified bacterial agents as the cause of diseases classified elsewhere: Secondary | ICD-10-CM

## 2018-11-11 MED ORDER — ALPRAZOLAM 0.25 MG PO TABS: 0.2500 mg | ORAL_TABLET | Freq: Three times a day (TID) | ORAL | 0 refills | Status: AC | PRN

## 2018-11-11 MED ORDER — SULFAMETHOXAZOLE-TRIMETHOPRIM 800-160 MG PO TABS: 1.0000 | ORAL_TABLET | Freq: Two times a day (BID) | ORAL | 0 refills | Status: DC

## 2018-11-11 NOTE — Progress Notes (Signed)
Virtual Visit via Video Note  I connected with Sandy EhlersChristina Johnson on 11/11/18 at 10:30 AM EDT by a video enabled telemedicine application and verified that I am speaking with the correct person using two identifiers.  Location: Patient: Home Provider: Office   I discussed the limitations of evaluation and management by telemedicine and the availability of in person appointments. The patient expressed understanding and agreed to proceed.  History of Present Illness:  Pt reports redness, warmth and tenderness of her right upper arm. She reports this started 3 days ago. She got a tattoo 1 week ago and thinks it may be infected. She has not noticed any drainage from the area but reports there is crusting. She has not run a fever but reports chills and sweats. She has been cleaning it with antibacterial soap and applying A&D ointment with minimal relief.  She would also like a refill of Xanax. She will be heading out of town next week.  Past Medical History:  Diagnosis Date  . Anxiety   . History of chicken pox   . Prior pregnancy with fetal demise 08/28/2015   H/o fetal demise at 20 weeks   . RLS (restless legs syndrome)     Current Outpatient Medications  Medication Sig Dispense Refill  . ALPRAZolam (XANAX) 0.25 MG tablet Take 1 tablet (0.25 mg total) by mouth 3 (three) times daily as needed for anxiety (for flying anxiety). 4 tablet 0  . Melatonin 5 MG CAPS Take by mouth.    . Multiple Vitamins-Minerals (CENTRUM ADULTS PO) Take by mouth.    Marland Kitchen. NUVARING 0.12-0.015 MG/24HR vaginal ring Insert vaginally and leave in place for 3 consecutive weeks, then remove for 1 week. 1 each 11  . triamcinolone cream (KENALOG) 0.1 % Apply 1 application topically 2 (two) times daily. 80 g 0  . sulfamethoxazole-trimethoprim (BACTRIM DS) 800-160 MG tablet Take 1 tablet by mouth 2 (two) times daily. 14 tablet 0   No current facility-administered medications for this visit.     Allergies  Allergen  Reactions  . Codeine Anaphylaxis  . Hydrocodone-Acetaminophen     Other reaction(s): GI Intolerance    Family History  Problem Relation Age of Onset  . Diabetes Maternal Grandfather   . Diabetes Father   . Hypertension Father   . Heart disease Father   . Hyperlipidemia Father   . Heart attack Father [id number]  . Diabetes Maternal Grandmother   . Breast cancer Maternal Grandmother   . Heart disease Paternal Grandfather   . Depression Sister   . Drug abuse Sister   . Hyperlipidemia Sister   . Hypertension Sister   . Miscarriages / Stillbirths Sister   . Drug abuse Brother        remission now    Social History   Socioeconomic History  . Marital status: Married    Spouse name: Sandy BoomDaniel  . Number of children: 1  . Years of education: High school  . Highest education level: Not on file  Occupational History  . Not on file  Social Needs  . Financial resource strain: Not hard at all  . Food insecurity    Worry: Not on file    Inability: Not on file  . Transportation needs    Medical: Not on file    Non-medical: Not on file  Tobacco Use  . Smoking status: Former Smoker    Packs/day: 0.25    Years: 5.00    Pack years: 1.25    Types: Cigarettes  Quit date: 03/13/2012    Years since quitting: 6.6  . Smokeless tobacco: Never Used  . Tobacco comment: was very occasional  Substance and Sexual Activity  . Alcohol use: Not Currently  . Drug use: Yes    Types: Marijuana    Comment: hx of cocaine abuse - clean 5 years, marijuana - more than once a day  . Sexual activity: Yes    Birth control/protection: Other-see comments    Comment: Nuvaring - 1 partner  Lifestyle  . Physical activity    Days per week: Not on file    Minutes per session: Not on file  . Stress: Not on file  Relationships  . Social Musician on phone: Not on file    Gets together: Not on file    Attends religious service: Not on file    Active member of club or organization: Not on file     Attends meetings of clubs or organizations: Not on file    Relationship status: Not on file  . Intimate partner violence    Fear of current or ex partner: Not on file    Emotionally abused: Not on file    Physically abused: Not on file    Forced sexual activity: Not on file  Other Topics Concern  . 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  Skin: Pt reports swelling, redness and warmth of RUE. Denies rashes, lesions or ulcercations.  Psych: Pt has a history of anxiety. Denies depression, SI/HI.  No other specific complaints in a complete review of systems (except as listed in HPI above).    Observations/Objective:  Temp 97.9 F (36.6 C) (Temporal)  Wt Readings from Last 3 Encounters:  09/05/18 179 lb 12 oz (81.5 kg)  05/09/18 193 lb 8 oz (87.8 kg)  04/02/18 194 lb 12 oz (88.3 kg)    General: Appears her stated age, well developed, well nourished in NAD. Skin: Redness and puffiness noted surrounding RUE tatto. Musculoskeletal: Normal internal and external rotation of the right shoulder. Neurological: Alert and oriented.  Psychiatric: Mood and affect normal. Behavior is normal. Judgment and thought content normal.     BMET No results found for: NA, K, CL, CO2, GLUCOSE, BUN, CREATININE, CALCIUM, GFRNONAA, GFRAA  Lipid Panel     Component Value Date/Time   CHOL 171 05/09/2018 1601   TRIG 136.0 05/09/2018 1601   HDL 49.50 05/09/2018 1601   CHOLHDL 3 05/09/2018 1601   VLDL 27.2 05/09/2018 1601   LDLCALC 94 05/09/2018 1601    CBC    Component Value Date/Time   WBC 13.2 (H) 08/29/2015 0532   RBC 3.82 (L) 08/29/2015 0532   HGB 10.5 (L) 08/29/2015 0532   HCT  31.5 (L) 08/29/2015 0532   PLT 151 08/29/2015 0532   MCV 82.5 08/29/2015 0532   MCH 27.5 08/29/2015 0532   MCHC 33.3 08/29/2015 0532   RDW 13.9 08/29/2015 0532    Hgb A1C Lab Results  Component Value Date   HGBA1C 5.4 05/09/2018       Assessment and Plan:  Infected Tattoo, RUE:  RX for Bactrim DS 1 tab PO  BID x 7 days Mix Neosporin with A&D ointment and cover 2 x day Work note provided  Flight Anxiety:  Xanax refilled today  Follow Up Instructions:    I discussed the assessment and treatment plan with the patient. The patient was provided an opportunity to ask questions and all were answered. The patient agreed with the plan and demonstrated an understanding of the instructions.   The patient was advised to call back or seek an in-person evaluation if the symptoms worsen or if the condition fails to improve as anticipated.     Webb Silversmith, NP

## 2018-11-11 NOTE — Patient Instructions (Signed)

## 2018-12-17 ENCOUNTER — Encounter: Payer: Self-pay | Admitting: Family Medicine

## 2018-12-17 ENCOUNTER — Ambulatory Visit (INDEPENDENT_AMBULATORY_CARE_PROVIDER_SITE_OTHER): Payer: 59 | Admitting: Family Medicine

## 2018-12-17 ENCOUNTER — Other Ambulatory Visit: Payer: Self-pay

## 2018-12-17 VITALS — BP 122/60 | HR 96 | Temp 98.2°F | Ht 63.0 in | Wt 189.4 lb

## 2018-12-17 DIAGNOSIS — M79662 Pain in left lower leg: Secondary | ICD-10-CM | POA: Diagnosis not present

## 2018-12-17 DIAGNOSIS — F419 Anxiety disorder, unspecified: Secondary | ICD-10-CM

## 2018-12-17 MED ORDER — BUSPIRONE HCL 15 MG PO TABS: 7.5000 mg | ORAL_TABLET | Freq: Two times a day (BID) | ORAL | 3 refills | Status: AC

## 2018-12-17 NOTE — Assessment & Plan Note (Signed)
Chronic/ongoing but recently worse Reviewed stressors/ coping techniques/symptoms/ support sources/ tx options and side effects in detail today  Pt reports "antidepressants" did not help in the past No SI  Reviewed depression/anxiety scores  Will try buspar 7.5 mg bid and report back Discussed expectations of this medication including time to effectiveness and mechanism of action, also poss of side effects (early and late)- including mental fuzziness, weight or appetite change, nausea and poss of worse dep or anxiety (even suicidal thoughts)  Pt voiced understanding and will stop med and update if this occurs   Offered counseling referral-pt needs to see if this is covered  >25 minutes spent in face to face time with patient, >50% spent in counselling or coordination of care including discussion of imp of self care/exercise and usefulness of meditation for anxiety Handout given F/u planned with pcp for about a month

## 2018-12-17 NOTE — Assessment & Plan Note (Signed)
This occurred acutely while bending over to squat with flat foot-suspect she pulled her gastroc tendon/muscle  Given area of pain and swelling-did order venous doppler to exclude dvt or baker's cyst inst to stay out of work tomorrow, elevate leg, use gentle heat and ibuprofen prn  Pending imaging inst to seek care in ED if symptoms suddenly worsen

## 2018-12-17 NOTE — Patient Instructions (Addendum)
Stay off your leg and elevate it  Use gentle heat to the painful area for 10 minutes at a time  I will refer you for an ultrasound -our office will call you about that   Ibuprofen with food 400-800 mg over the counter three times daily (every 8 hours) - use caution of stomach upset   If symptoms suddenly worsen let us know   Let's keep you out of work tomorrow   Find out if [employer] covers counseling and let me know if it does   Start buspar 1/2 of a 15 mg pill twice daily for anxiety  If any intolerable side effects please let me know and hold it If you get worse let me know   Exercise and meditation help anxiety  Take care of yourself

## 2018-12-17 NOTE — Progress Notes (Signed)
Subjective:    Patient ID: Sandy Johnson, female    DOB: 1991-03-12, 28 y.o.   MRN: [id number]  HPI  [age] yo pt of Dr Selena Battenody presents with swollen left calf and anxiety symptoms   Wt Readings from Last 3 Encounters:  12/17/18 189 lb 6 oz (85.9 kg)  09/05/18 179 lb 12 oz (81.5 kg)  05/09/18 193 lb 8 oz (87.8 kg)   33.55 kg/m   Former brief smoker -quit in 2014 Past hx of drug abuse- cocaine and xanax and THC  nuva ring for contraception  LMP- about 3 mo ago  Neg pregnancy test 3 weeks ago  Uses her nuvaring continuously to avoid menses   Anxiety is really bad for the past 2-3 mo  Tearful  Irritable   Married  Has a 29 yo - starting speech therapy  Works  A lot of stress   Feels down  Affects appetite Difficulty concentrating  Cannot relax/worries all the time   Never been this depressed or anxious before  Unsure why  Misses home in [place] (here for 4 years)   Uses marijuana -it helps some  Plans to quit   Does not drink alcohol   Takes xanax to fly  Does not remember what anti depressants she was on before  No h/o SI or attempt No SI now   PHQ-9 score is 16     Saturday she bent down and she suddenly developed sharp pain Shot up her leg and she felt down It went "out" on her  Cannot squat or bend over  Is limping a bit  Lateral calf is the worst Foot tingles a bit  Throbs when it hangs dependent  Did not bruise  Has not elevated or iced it  Took a flexeril last night-helped her sleep   Ankle hurts a bit  Knee does not  Wears steel toed boots during job - active/lifting   Tends to get calf cramps at night baseline This feels different   Patient Active Problem List   Diagnosis Date Noted  . Pain of left calf 12/17/2018  . Other fatigue 09/05/2018  . Vaginal dryness 09/05/2018  . Anxiety 09/05/2018  . Anxiety with flying 04/02/2018  . Marijuana use 04/02/2018  . Obesity (BMI 30-39.9) 04/02/2018  . Hx of mixed drug abuse (HCC) 08/28/2015    Past Medical History:  Diagnosis Date  . Anxiety   . History of chicken pox   . Prior pregnancy with fetal demise 08/28/2015   H/o fetal demise at 20 weeks   . RLS (restless legs syndrome)    Past Surgical History:  Procedure Laterality Date  . TONSILLECTOMY    . WISDOM TOOTH EXTRACTION     Social History   Tobacco Use  . Smoking status: Former Smoker    Packs/day: 0.25    Years: 5.00    Pack years: 1.25    Types: Cigarettes    Quit date: 03/13/2012    Years since quitting: 6.7  . Smokeless tobacco: Never Used  . Tobacco comment: was very occasional  Substance Use Topics  . Alcohol use: Not Currently  . Drug use: Yes    Types: Marijuana    Comment: hx of cocaine abuse - clean 5 years, marijuana - more than once a day   Family History  Problem Relation Age of Onset  . Diabetes Maternal Grandfather   . Diabetes Father   . Hypertension Father   . Heart disease Father   . Hyperlipidemia  Father   . Heart attack Father 75  . Diabetes Maternal Grandmother   . Breast cancer Maternal Grandmother   . Heart disease Paternal Grandfather   . Depression Sister   . Drug abuse Sister   . Hyperlipidemia Sister   . Hypertension Sister   . Miscarriages / Stillbirths Sister   . Drug abuse Brother        remission now   Allergies  Allergen Reactions  . Codeine Anaphylaxis  . Hydrocodone-Acetaminophen     Other reaction(s): GI Intolerance   Current Outpatient Medications on File Prior to Visit  Medication Sig Dispense Refill  . ALPRAZolam (XANAX) 0.25 MG tablet Take 1 tablet (0.25 mg total) by mouth 3 (three) times daily as needed for anxiety (for flying anxiety). 4 tablet 0  . Melatonin 5 MG CAPS Take by mouth.    . Multiple Vitamins-Minerals (CENTRUM ADULTS PO) Take by mouth.    Marland Kitchen NUVARING 0.12-0.015 MG/24HR vaginal ring Insert vaginally and leave in place for 3 consecutive weeks, then remove for 1 week. 1 each 11  . triamcinolone cream (KENALOG) 0.1 % Apply 1 application  topically 2 (two) times daily. 80 g 0   No current facility-administered medications on file prior to visit.     Review of Systems  Constitutional: Negative for activity change, appetite change, fatigue, fever and unexpected weight change.  HENT: Negative for congestion, ear pain, rhinorrhea, sinus pressure and sore throat.   Eyes: Negative for pain, redness and visual disturbance.  Respiratory: Negative for cough, shortness of breath and wheezing.   Cardiovascular: Negative for chest pain and palpitations.  Gastrointestinal: Negative for abdominal pain, blood in stool, constipation and diarrhea.  Endocrine: Negative for polydipsia and polyuria.  Genitourinary: Negative for dysuria, frequency and urgency.  Musculoskeletal: Negative for arthralgias, back pain and myalgias.       Left leg pain   Skin: Negative for pallor and rash.  Allergic/Immunologic: Negative for environmental allergies.  Neurological: Negative for dizziness, syncope and headaches.  Hematological: Negative for adenopathy. Does not bruise/bleed easily.  Psychiatric/Behavioral: Positive for dysphoric mood. Negative for decreased concentration, self-injury and suicidal ideas. The patient is nervous/anxious.        Objective:   Physical Exam Constitutional:      General: She is not in acute distress.    Appearance: Normal appearance. She is well-developed. She is obese. She is not ill-appearing or diaphoretic.  HENT:     Head: Normocephalic and atraumatic.     Mouth/Throat:     Mouth: Mucous membranes are moist.  Eyes:     Conjunctiva/sclera: Conjunctivae normal.     Pupils: Pupils are equal, round, and reactive to light.  Neck:     Musculoskeletal: Normal range of motion and neck supple. No neck rigidity or muscular tenderness.     Thyroid: No thyromegaly.     Vascular: No carotid bruit or JVD.  Cardiovascular:     Rate and Rhythm: Regular rhythm. Tachycardia present.     Pulses: Normal pulses.     Heart  sounds: Normal heart sounds. No gallop.      Comments: Normal pedal pulses  Pulmonary:     Effort: Pulmonary effort is normal. No respiratory distress.     Breath sounds: Normal breath sounds. No stridor. No wheezing, rhonchi or rales.     Comments: Good air exchange Abdominal:     General: Bowel sounds are normal. There is no distension or abdominal bruit.     Palpations: Abdomen is soft.  There is no mass.     Tenderness: There is no abdominal tenderness. There is no guarding or rebound.  Musculoskeletal:        General: Swelling and tenderness present. No deformity.     Right lower leg: No edema.     Left lower leg: Edema present.     Comments: Left calf is mildly swollen  Tender along the lateral edge in soft tissues (not bone) No erythema or bruising No mass or palpable cord noted Nl rom of ankle (inc pain to dorsiflex ankle and flex hip on L) No hamstring or upper leg tenderness  Pain to put full weight on her L leg  Lymphadenopathy:     Cervical: No cervical adenopathy.  Skin:    General: Skin is warm and dry.     Coloration: Skin is not pale.     Findings: No erythema or rash.  Neurological:     Mental Status: She is alert.     Cranial Nerves: No cranial nerve deficit.     Sensory: No sensory deficit.     Motor: No weakness.     Deep Tendon Reflexes: Reflexes are normal and symmetric. Reflexes normal.  Psychiatric:        Attention and Perception: Attention normal.        Mood and Affect: Mood is anxious. Mood is not depressed. Affect is not flat.        Speech: Speech normal.        Cognition and Memory: Cognition and memory normal.     Comments: Mildly anxious  Not seemingly depressed Talks candidly about her symptoms             Assessment & Plan:   Problem List Items Addressed This Visit      Other   Anxiety    Chronic/ongoing but recently worse Reviewed stressors/ coping techniques/symptoms/ support sources/ tx options and side effects in detail  today  Pt reports "antidepressants" did not help in the past No SI  Reviewed depression/anxiety scores  Will try buspar 7.5 mg bid and report back Discussed expectations of this medication including time to effectiveness and mechanism of action, also poss of side effects (early and late)- including mental fuzziness, weight or appetite change, nausea and poss of worse dep or anxiety (even suicidal thoughts)  Pt voiced understanding and will stop med and update if this occurs   Offered counseling referral-pt needs to see if this is covered  >25 minutes spent in face to face time with patient, >50% spent in counselling or coordination of care including discussion of imp of self care/exercise and usefulness of meditation for anxiety Handout given F/u planned with pcp for about a month      Relevant Medications   busPIRone (BUSPAR) 15 MG tablet   Pain of left calf - Primary    This occurred acutely while bending over to squat with flat foot-suspect she pulled her gastroc tendon/muscle  Given area of pain and swelling-did order venous doppler to exclude dvt or baker's cyst inst to stay out of work tomorrow, elevate leg, use gentle heat and ibuprofen prn  Pending imaging inst to seek care in ED if symptoms suddenly worsen       Relevant Orders   US Venous Img Lower Unilateral Left

## 2018-12-18 ENCOUNTER — Ambulatory Visit
Admission: RE | Admit: 2018-12-18 | Discharge: 2018-12-18 | Disposition: A | Discharge status: Home / Self Care | Payer: 59 | Source: Ambulatory Visit | Attending: Family Medicine | Admitting: Family Medicine

## 2018-12-18 ENCOUNTER — Telehealth: Payer: Self-pay

## 2018-12-18 DIAGNOSIS — M79662 Pain in left lower leg: Secondary | ICD-10-CM

## 2018-12-18 NOTE — Telephone Encounter (Signed)
Ultrasound negative for clots or cysts -good news  I suspect some rest/elevation as we discussed at her visit  How is she today?

## 2018-12-18 NOTE — Telephone Encounter (Signed)
Pt notified of [place] results and Dr. Marliss Coots comments. Pt said leg/pain is the exact same she hasn't had any improvement in her sxs at all

## 2018-12-18 NOTE — Telephone Encounter (Signed)
Keep elevating when she can and using heat Please make an appt with Dr Lorelei Pont to eval further

## 2018-12-18 NOTE — Telephone Encounter (Signed)
Per DPR left VM letting pt know Dr. Tower's comments  

## 2018-12-18 NOTE — Telephone Encounter (Signed)
[facility] [place] called report for US venous Img lower unilateral left was negative for DV and bakers cyst. Pt has gone. Report in Epic and taking report to Dr Marliss Coots area.

## 2018-12-19 ENCOUNTER — Telehealth: Payer: Self-pay | Admitting: Family Medicine

## 2018-12-19 NOTE — Telephone Encounter (Signed)
Patient was seen on Tuesday.  Patient called and asked if she can be put on a mood stabilizer.  Patient said she's angry all the time.  Patient uses [facility].

## 2018-12-19 NOTE — Telephone Encounter (Signed)
I don't px mood stabilizers.  She will have to f/u with pcp when she returns. If she would like a referral to psychiatry let me know. If feeling suicidal or in danger I suggest [facility].

## 2018-12-19 NOTE — Telephone Encounter (Signed)
Pt notified of Dr. Marliss Coots comments. Pt said that she will just call back when Dr. Einar Pheasant returns to get f/u appt scheduled with her to discuss this.

## 2019-01-17 ENCOUNTER — Other Ambulatory Visit: Payer: Self-pay

## 2019-01-17 ENCOUNTER — Encounter: Payer: Self-pay | Admitting: Family Medicine

## 2019-01-17 ENCOUNTER — Ambulatory Visit (INDEPENDENT_AMBULATORY_CARE_PROVIDER_SITE_OTHER): Payer: 59 | Admitting: Family Medicine

## 2019-01-17 VITALS — BP 130/70 | HR 92 | Temp 97.8°F | Ht 63.0 in | Wt 189.0 lb

## 2019-01-17 DIAGNOSIS — N926 Irregular menstruation, unspecified: Secondary | ICD-10-CM

## 2019-01-17 DIAGNOSIS — F419 Anxiety disorder, unspecified: Secondary | ICD-10-CM | POA: Diagnosis not present

## 2019-01-17 LAB — POCT URINE PREGNANCY: Preg Test, Ur: NEGATIVE

## 2019-01-17 MED ORDER — PAROXETINE HCL 20 MG PO TABS: 20.0000 mg | ORAL_TABLET | Freq: Every day | ORAL | 3 refills | Status: DC

## 2019-01-17 NOTE — Progress Notes (Signed)
Subjective:    Patient ID: Sandy EhlersChristina Johnson, female    DOB: [birth year], [age] y.o.   MRN: [id number]  HPI 28 yo pt of Dr Selena Battenody is here for a f/u appt   Anxiety is worse  Given buspar last time- not helpful  She stopped it  Was evicted from her apt -more stress   Her job is transferring her to [place]   Has to pack her son up and move back to [place]  Her landlord is dying- they are selling the house and gave them a 30 day notice to leave  Cannot find a place to live   Husband has to stay here to work  Has a camper - difficult for the whole family   No time for self care   Last visit offered a counseling referral    Wt Readings from Last 3 Encounters:  01/17/19 189 lb (85.7 kg)  12/17/18 189 lb 6 oz (85.9 kg)  09/05/18 179 lb 12 oz (81.5 kg)   33.48 kg/m   Missed menses for 3 months  nuva ring -was using continuously   She took a friend's klonopin  It made her mean   Anxiety symptoms :  Irritable  Nervous  Cannot relax  Does not feel depressed  No SI   Some caffeine- 2 cheerwine soda per day    ? If can get counseling in [place]  Moving on the 25th    Uses nuva ring continuously so no menses Results for orders placed or performed in visit on 01/17/19  POCT urine pregnancy  Result Value Ref Range   Preg Test, Ur Negative Negative    Patient Active Problem List   Diagnosis Date Noted  . Pain of left calf 12/17/2018  . Other fatigue 09/05/2018  . Vaginal dryness 09/05/2018  . Anxiety 09/05/2018  . Anxiety with flying 04/02/2018  . Marijuana use 04/02/2018  . Obesity (BMI 30-39.9) 04/02/2018  . Hx of mixed drug abuse (HCC) 08/28/2015   Past Medical History:  Diagnosis Date  . Anxiety   . History of chicken pox   . Prior pregnancy with fetal demise 08/28/2015   H/o fetal demise at 20 weeks   . RLS (restless legs syndrome)    Past Surgical History:  Procedure Laterality Date  . TONSILLECTOMY    . WISDOM TOOTH EXTRACTION     Social History   Tobacco Use  . Smoking status: Former Smoker    Packs/day: 0.25    Years: 5.00    Pack years: 1.25    Types: Cigarettes    Quit date: 03/13/2012    Years since quitting: 6.8  . Smokeless tobacco: Never Used  . Tobacco comment: was very occasional  Substance Use Topics  . Alcohol use: Not Currently  . Drug use: Yes    Types: Marijuana    Comment: hx of cocaine abuse - clean 5 years, marijuana - more than once a day   Family History  Problem Relation Age of Onset  . Diabetes Maternal Grandfather   . Diabetes Father   . Hypertension Father   . Heart disease Father   . Hyperlipidemia Father   . Heart attack Father [id number]  . Diabetes Maternal Grandmother   . Breast cancer Maternal Grandmother   . Heart disease Paternal Grandfather   . Depression Sister   . Drug abuse Sister   . Hyperlipidemia Sister   . Hypertension Sister   . Miscarriages / Stillbirths Sister   . Drug abuse Brother  remission now   Allergies  Allergen Reactions  . Codeine Anaphylaxis  . Hydrocodone-Acetaminophen     Other reaction(s): GI Intolerance   Current Outpatient Medications on File Prior to Visit  Medication Sig Dispense Refill  . ALPRAZolam (XANAX) 0.25 MG tablet Take 1 tablet (0.25 mg total) by mouth 3 (three) times daily as needed for anxiety (for flying anxiety). 4 tablet 0  . Melatonin 5 MG CAPS Take by mouth.    . Multiple Vitamins-Minerals (CENTRUM ADULTS PO) Take by mouth.    Marland Kitchen NUVARING 0.12-0.015 MG/24HR vaginal ring Insert vaginally and leave in place for 3 consecutive weeks, then remove for 1 week. 1 each 11  . triamcinolone cream (KENALOG) 0.1 % Apply 1 application topically 2 (two) times daily. 80 g 0  . busPIRone (BUSPAR) 15 MG tablet Take 0.5 tablets (7.5 mg total) by mouth 2 (two) times daily. (Patient not taking: Reported on 01/17/2019) 30 tablet 3   No current facility-administered medications on file prior to visit.       Review of Systems  Constitutional: Negative for  activity change, appetite change, fatigue, fever and unexpected weight change.  HENT: Negative for congestion, ear pain, rhinorrhea, sinus pressure and sore throat.   Eyes: Negative for pain, redness and visual disturbance.  Respiratory: Negative for cough, shortness of breath and wheezing.   Cardiovascular: Negative for chest pain and palpitations.  Gastrointestinal: Negative for abdominal pain, blood in stool, constipation and diarrhea.  Endocrine: Negative for polydipsia and polyuria.  Genitourinary: Negative for dysuria, frequency and urgency.  Musculoskeletal: Negative for arthralgias, back pain and myalgias.  Skin: Negative for pallor and rash.  Allergic/Immunologic: Negative for environmental allergies.  Neurological: Negative for dizziness, syncope and headaches.  Hematological: Negative for adenopathy. Does not bruise/bleed easily.  Psychiatric/Behavioral: Positive for decreased concentration, dysphoric mood and sleep disturbance. Negative for confusion, hallucinations, self-injury and suicidal ideas. The patient is nervous/anxious.        Objective:   Physical Exam Constitutional:      General: She is not in acute distress.    Appearance: Normal appearance. She is obese. She is not ill-appearing or diaphoretic.  HENT:     Head: Normocephalic and atraumatic.     Mouth/Throat:     Mouth: Mucous membranes are moist.  Eyes:     Extraocular Movements: Extraocular movements intact.     Conjunctiva/sclera: Conjunctivae normal.     Pupils: Pupils are equal, round, and reactive to light.  Neck:     Musculoskeletal: Normal range of motion.  Cardiovascular:     Rate and Rhythm: Normal rate and regular rhythm.     Heart sounds: Normal heart sounds.  Pulmonary:     Effort: Pulmonary effort is normal. No respiratory distress.     Breath sounds: Normal breath sounds. No wheezing.  Lymphadenopathy:     Cervical: No cervical adenopathy.  Skin:    General: Skin is warm and dry.      Coloration: Skin is not pale.     Findings: No erythema.  Neurological:     Mental Status: She is alert.     Cranial Nerves: No cranial nerve deficit.     Motor: No weakness.     Coordination: Coordination normal.     Comments: No tremor  Psychiatric:        Attention and Perception: Attention normal.        Mood and Affect: Mood is anxious. Affect is not tearful.        Speech:  Speech normal.        Behavior: Behavior normal.        Cognition and Memory: Cognition normal.     Comments: Anxious  Talks candidly about her stressors           Assessment & Plan:   Problem List Items Addressed This Visit      Other   Anxiety    Moderate anxiety worsened with recent stressors (will be moving soon) Did not improve with low dose buspar  Declines counseling ref but will be open to that after she moves to [place]  Reviewed stressors/ coping techniques/symptoms/ support sources/ tx options and side effects in detail today  Px paroxetine 20 mg (10 mg daily for 1 wk then adv to 20) Discussed expectations of SSRI medication including time to effectiveness and mechanism of action, also poss of side effects (early and late)- including mental fuzziness, weight or appetite change, nausea and poss of worse dep or anxiety (even suicidal thoughts)  Pt voiced understanding and will stop med and update if this occurs   Disc imp of self care/ may benefit from exercise and relaxation techniques No SI or depressive symptoms currently >25 minutes spent in face to face time with patient, >50% spent in counselling or coordination of care , including discussion of pharm and non pharm opt for tx of anxiety  Given handout for gen anx disorder      Relevant Medications   PARoxetine (PAXIL) 20 MG tablet    Other Visit Diagnoses    Missed period    -  Primary   Relevant Orders   POCT urine pregnancy (Completed)

## 2019-01-17 NOTE — Patient Instructions (Addendum)
Start 1/2 of a paroxetine 20 mg pill once daily in the evening If no problems - increase to a whole pill daily a week later   If any intolerable side effects, stop it and let us know (or if it makes you feel worse)   Give it a month to really see if this works Get established with a new primary provider in [place]   Try and take care of yourself Exercise helps  Outdoor time helps  So does quiet time and meditation

## 2019-01-18 NOTE — Assessment & Plan Note (Signed)
Moderate anxiety worsened with recent stressors (will be moving soon) Did not improve with low dose buspar  Declines counseling ref but will be open to that after she moves to [place]  Reviewed stressors/ coping techniques/symptoms/ support sources/ tx options and side effects in detail today  Px paroxetine 20 mg (10 mg daily for 1 wk then adv to 20) Discussed expectations of SSRI medication including time to effectiveness and mechanism of action, also poss of side effects (early and late)- including mental fuzziness, weight or appetite change, nausea and poss of worse dep or anxiety (even suicidal thoughts)  Pt voiced understanding and will stop med and update if this occurs   Disc imp of self care/ may benefit from exercise and relaxation techniques No SI or depressive symptoms currently >25 minutes spent in face to face time with patient, >50% spent in counselling or coordination of care , including discussion of pharm and non pharm opt for tx of anxiety  Given handout for gen anx disorder

## 2019-02-19 ENCOUNTER — Telehealth: Payer: Self-pay | Admitting: Family Medicine

## 2019-02-19 NOTE — Telephone Encounter (Signed)
Ok to send medication to that pharmacy?

## 2019-02-19 NOTE — Telephone Encounter (Signed)
Patient has moved to [place].  Patient has run out of Paroxetine.  Patient hasn't found a doctor in [place].  Patient had 3 refills left on her original rx.  Patient wants to know if she can get the rx transferred to [facility]

## 2019-02-20 MED ORDER — PAROXETINE HCL 20 MG PO TABS: 20.0000 mg | ORAL_TABLET | Freq: Every day | ORAL | 2 refills | Status: AC

## 2019-02-20 NOTE — Telephone Encounter (Signed)
RX sent in. Left detailed message on patient's phone number advising her of refill. (ok per DPR on file)

## 2019-02-20 NOTE — Addendum Note (Signed)
Addended by: Kris Mouton on: 02/20/2019 12:22 PM   Modules accepted: Orders

## 2019-02-20 NOTE — Telephone Encounter (Signed)
Ok to transfer Paroxetine prescription - for up to 3 month supply to allow time to find a new provider.   Will not be able to fill any xanax.

## 2019-05-28 ENCOUNTER — Other Ambulatory Visit: Payer: Self-pay | Admitting: Family Medicine

## 2019-11-29 IMAGING — US US EXTREM LOW VENOUS*L*
1 series · 13 of 24 positions shown · non-contrast
Comparison: None.

CLINICAL DATA: 28-year-old female with a history of calf pain



[Series 1: us extrem low venous*left* · 0.07mm/px · 13 of 35 slices shown]
[im 1/35]
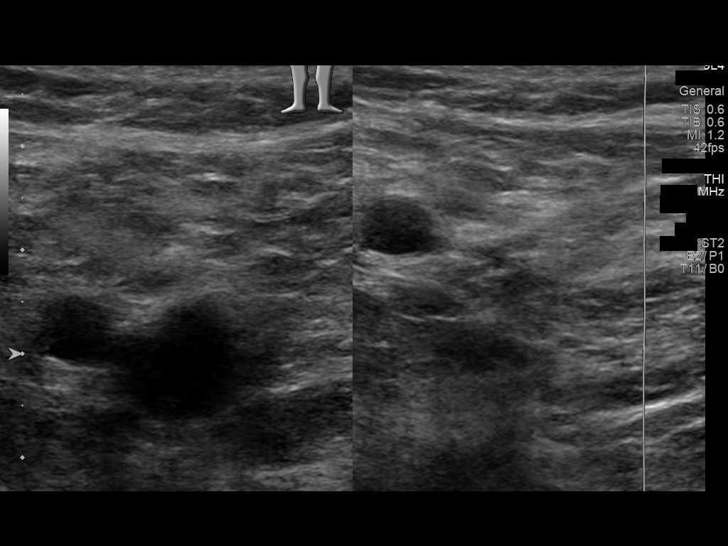
[im 3/35]
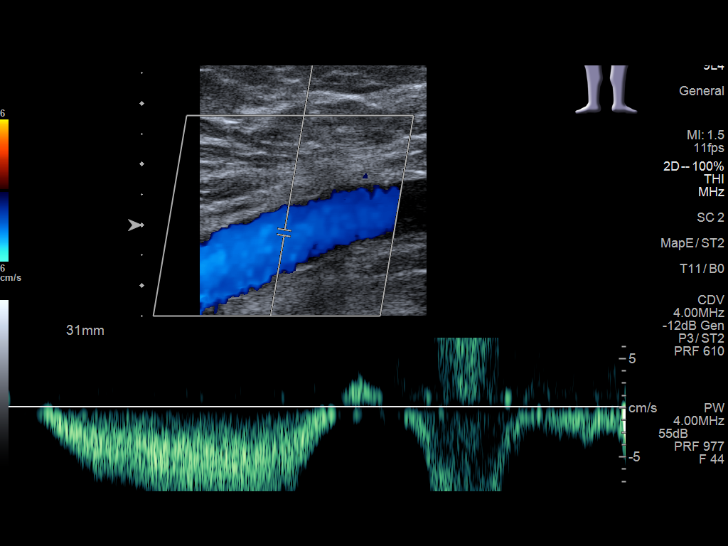
[im 6/35]
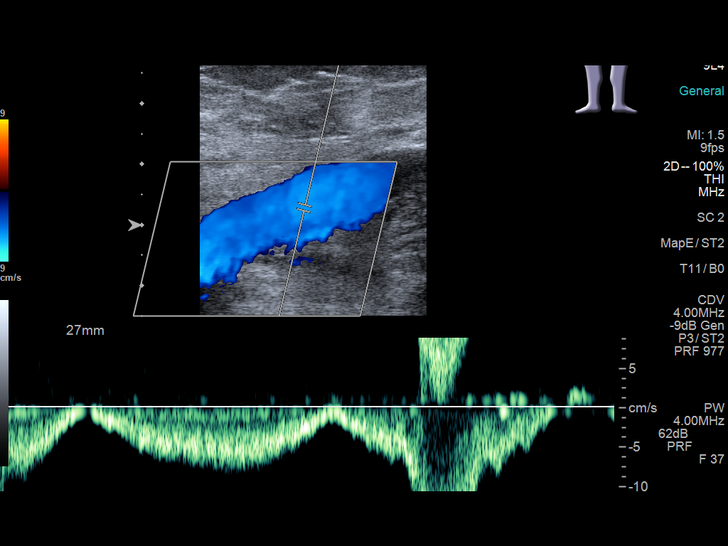
[im 9/35]
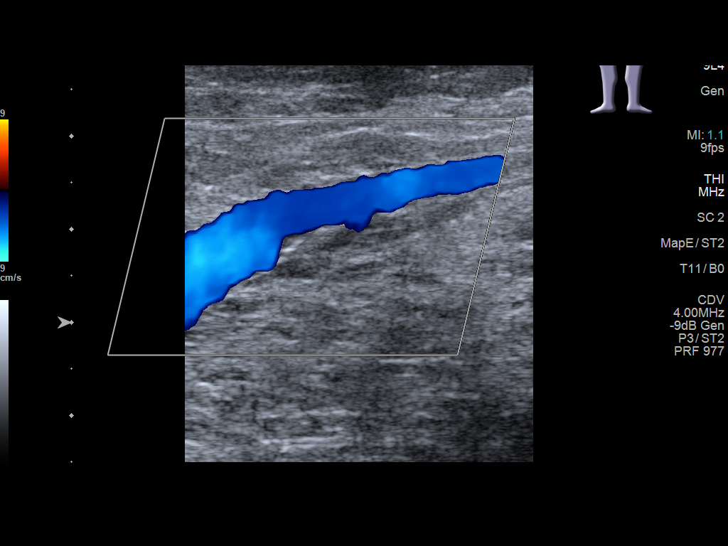
[im 12/35]
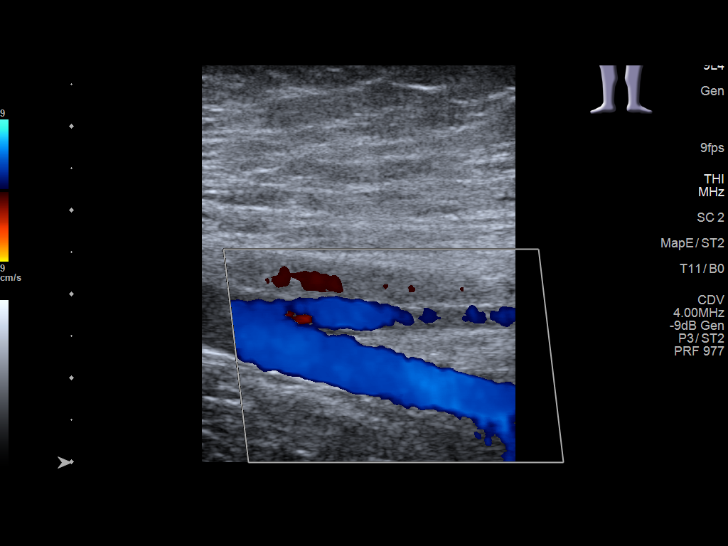
[im 15/35]
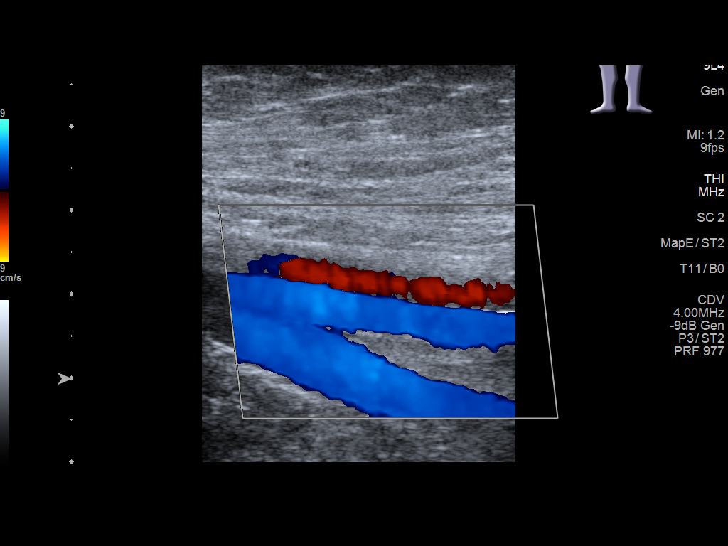
[im 18/35]
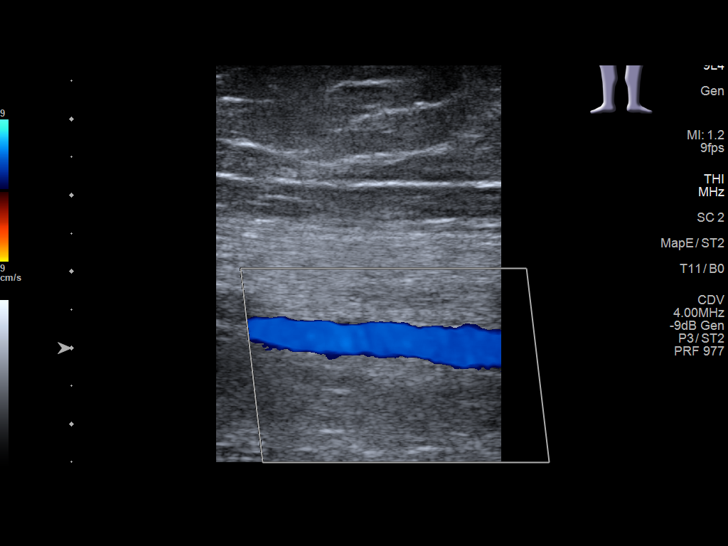
[im 20/35]
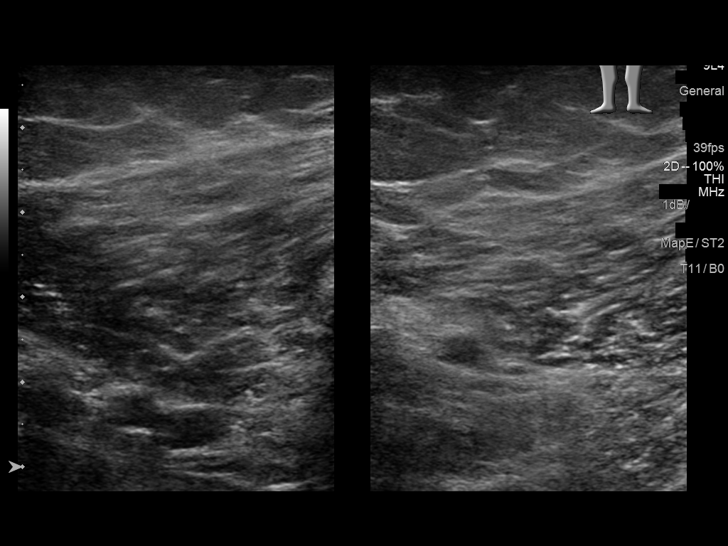
[im 23/35]
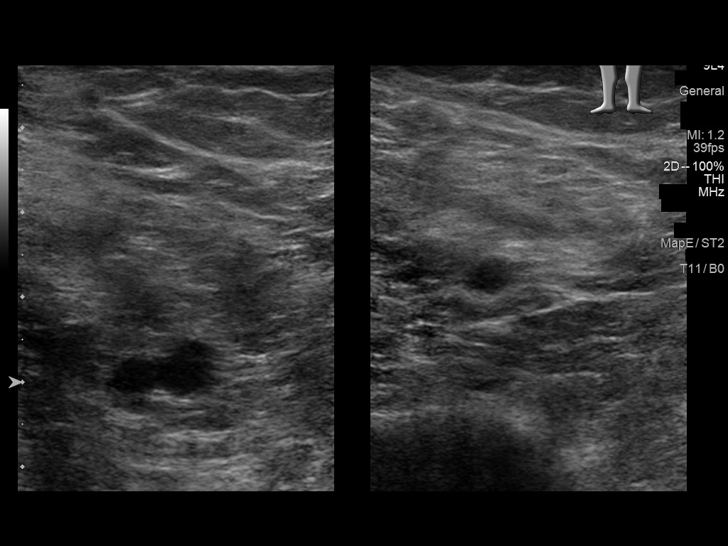
[im 26/35]
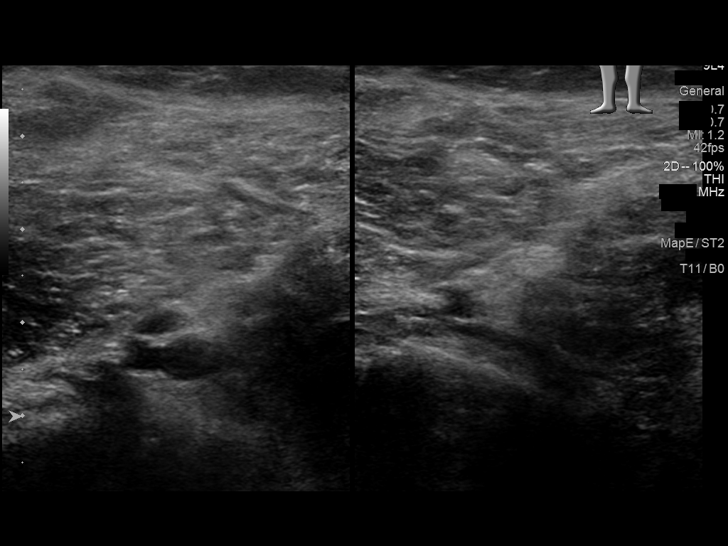
[im 29/35]
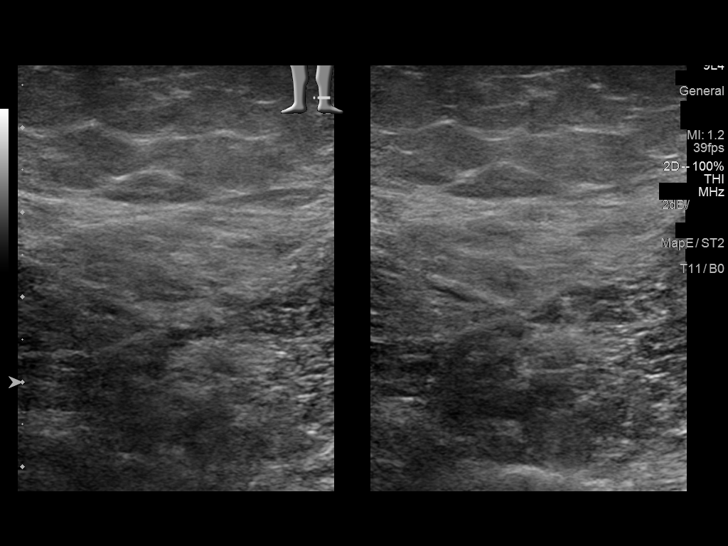
[im 32/35]
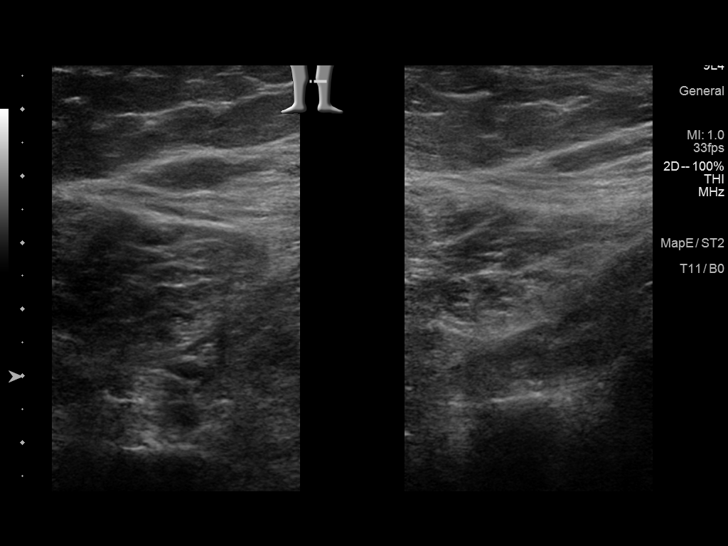
[im 35/35]
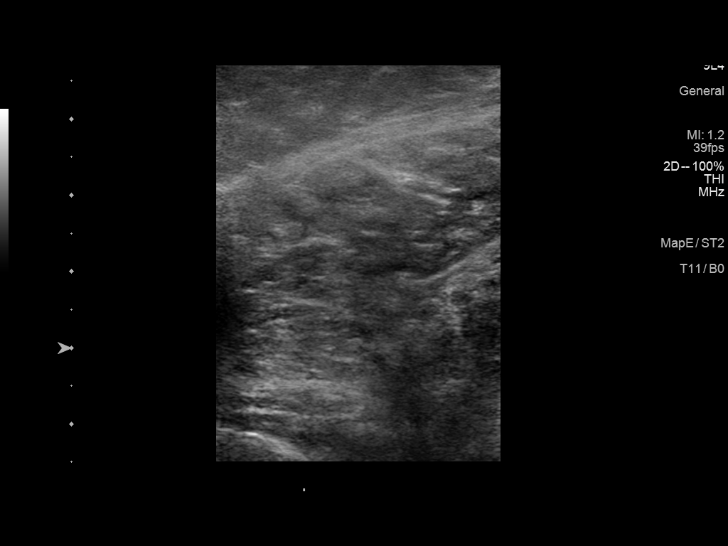

[13 of 24 positions shown; findings below may reference images not displayed]

FINDINGS: Contralateral Common Femoral Vein: Respiratory phasicity is normal
and symmetric with the symptomatic side. No evidence of thrombus.
Normal compressibility.

Common Femoral Vein: No evidence of thrombus. Normal
compressibility, respiratory phasicity and response to augmentation.

Saphenofemoral Junction: No evidence of thrombus. Normal
compressibility and flow on color Doppler imaging.

Profunda Femoral Vein: No evidence of thrombus. Normal
compressibility and flow on color Doppler imaging.

Femoral Vein: No evidence of thrombus. Normal compressibility,
respiratory phasicity and response to augmentation.

Popliteal Vein: No evidence of thrombus. Normal compressibility,
respiratory phasicity and response to augmentation.

Calf Veins: No evidence of thrombus. Normal compressibility and flow
on color Doppler imaging.

Superficial Great Saphenous Vein: No evidence of thrombus. Normal
compressibility and flow on color Doppler imaging.

Other Findings:  None.
IMPRESSION: Sonographic survey of the left lower extremity negative for DVT
